# Patient Record
Sex: Female | Born: 1995 | Marital: Single | Smoking: Never smoker
Health system: Southern US, Community
[De-identification: ages and names within clinical notes are randomized; demographics above are authoritative.]

---

## 2013-05-17 ENCOUNTER — Ambulatory Visit (INDEPENDENT_AMBULATORY_CARE_PROVIDER_SITE_OTHER): Payer: Managed Care, Other (non HMO) | Admitting: Sports Medicine

## 2013-05-17 DIAGNOSIS — L750 Bromhidrosis: Secondary | ICD-10-CM | POA: Insufficient documentation

## 2013-05-17 DIAGNOSIS — L708 Other acne: Secondary | ICD-10-CM

## 2013-05-17 DIAGNOSIS — M654 Radial styloid tenosynovitis [de Quervain]: Secondary | ICD-10-CM

## 2013-05-17 DIAGNOSIS — L748 Other eccrine sweat disorders: Secondary | ICD-10-CM

## 2013-05-17 DIAGNOSIS — L700 Acne vulgaris: Secondary | ICD-10-CM | POA: Insufficient documentation

## 2013-05-17 DIAGNOSIS — R6889 Other general symptoms and signs: Secondary | ICD-10-CM

## 2013-05-17 DIAGNOSIS — H6120 Impacted cerumen, unspecified ear: Secondary | ICD-10-CM | POA: Insufficient documentation

## 2013-05-17 DIAGNOSIS — R196 Halitosis: Secondary | ICD-10-CM

## 2013-05-17 DIAGNOSIS — B070 Plantar wart: Secondary | ICD-10-CM

## 2013-05-17 DIAGNOSIS — J309 Allergic rhinitis, unspecified: Secondary | ICD-10-CM

## 2013-05-17 DIAGNOSIS — H6123 Impacted cerumen, bilateral: Secondary | ICD-10-CM

## 2013-05-17 MED ORDER — FLUTICASONE PROPIONATE 50 MCG/ACT NA SUSP: NASAL | Status: DC

## 2013-05-17 MED ORDER — CLINDAMYCIN PHOS-BENZOYL PEROX 1-5 % EX GEL: Freq: Two times a day (BID) | CUTANEOUS | Status: DC

## 2013-05-17 NOTE — Assessment & Plan Note (Signed)
Brush teeth twice a day, mouthwash twice a day.

## 2013-05-17 NOTE — Patient Instructions (Addendum)
Ensure that the Deodorant has Aluminum Zirconium.

## 2013-05-17 NOTE — Assessment & Plan Note (Signed)
Plays guitar. Thumb spica brace. Ibuprofen 800 mg 3 times a day. Home exercises. Return in a few months, if no better consider injection.

## 2013-05-17 NOTE — Assessment & Plan Note (Signed)
Deodorant twice a day, Ensure contains aluminum zirconium tetrachloride.

## 2013-05-17 NOTE — Assessment & Plan Note (Signed)
BenzaClin twice a day, return in 3 months.

## 2013-05-17 NOTE — Assessment & Plan Note (Signed)
Over the counter H1 blockers, Flonase. Return as needed for this.

## 2013-05-17 NOTE — Assessment & Plan Note (Signed)
Removed by physician with curette bilaterally.

## 2013-05-17 NOTE — Progress Notes (Signed)
  Subjective:    CC: Establish care.   HPI:  This pleasant 17 year old female comes in with multiple complaints, I am seeing her father, mother, and sister.  Acne: Present for years, feels that there is a malodorous discharge from the face.  Halitosis: Brushes teeth daily, does not use mouthwash. Has braces.  Abnormal body: uses over-the-counter deodorant, did change a little bit with puberty, does not sweat excessively. Showers daily.  Left wrist pain: In school for music, and plays the guitar, pain is localized over the radial styloid process, radiates into the forearm, did have an injection in the distant past which only provided minimal benefit, has never had bracing or physical therapy. Moderate, persistent.  Runny nose: Typically happens around this season.  Plantar wart: Desires cryotherapy.  Past medical history, Surgical history, Family history not pertinant except as noted below, Social history, Allergies, and medications have been entered into the medical record, reviewed, and no changes needed.   Review of Systems: No headache, visual changes, nausea, vomiting, diarrhea, constipation, dizziness, abdominal pain, skin rash, fevers, chills, night sweats, swollen lymph nodes, weight loss, chest pain, body aches, joint swelling, muscle aches, shortness of breath, mood changes, visual or auditory hallucinations.  Objective:    General: Well Developed, well nourished, and in no acute distress.  Neuro: Alert and oriented x3, extra-ocular muscles intact, sensation grossly intact.  HEENT: Normocephalic, atraumatic, pupils equal round reactive to light, neck supple, no masses, no lymphadenopathy, thyroid nonpalpable. Cerumen impactions present bilaterally.  Skin: Warm and dry, no rashes noted. Plantar wart on the right foot.  Cardiac: Regular rate and rhythm, no murmurs rubs or gallops.  Respiratory: Clear to auscultation bilaterally. Not using accessory muscles, speaking in full  sentences.  Abdominal: Soft, nontender, nondistended, positive bowel sounds, no masses, no organomegaly.  Left Wrist: Inspection normal with no visible erythema or swelling. ROM smooth and normal with good flexion and extension and ulnar/radial deviation that is symmetrical with opposite wrist. Palpation is normal over metacarpals, navicular, lunate, and TFCC; tendons without tenderness/ swelling No snuffbox tenderness. No tenderness over Canal of [place]. Strength 5/5 in all directions without pain. Positive Finkelstein sign, negative Tinel's and Phalen's signs. Negative Watson's test.  Procedure:  Cryodestruction of right foot plantar wart. Consent obtained and verified. Time-out conducted. Noted no overlying erythema, induration, or other signs of local infection. Completed without difficulty using Cryo-Gun. Advised to call if fevers/chills, erythema, induration, drainage, or persistent bleeding.  Indication: Cerumen impaction of both ear(s) Medical necessity statement: On physical examination, cerumen impairs clinically significant portions of the external auditory canal, and tympanic membrane. Noted obstructive, copious cerumen that cannot be removed without magnification and instrumentations requiring physician skills Consent: Discussed benefits and risks of procedure and verbal consent obtained Procedure: Patient was prepped for the procedure. Utilized an otoscope to assess and take note of the ear canal, the tympanic membrane, and the presence, amount, and placement of the cerumen. Gentle water irrigation and soft plastic curette was utilized to remove cerumen.  Post procedure examination: shows cerumen was completely removed. Patient tolerated procedure well. The patient is made aware that they may experience temporary vertigo, temporary hearing loss, and temporary discomfort. If these symptom last for more than 24 hours to call the clinic or proceed to the ED.  Impression and  Recommendations:    The patient was counselled, risk factors were discussed, anticipatory guidance given.

## 2013-05-17 NOTE — Assessment & Plan Note (Signed)
Cryotherapy performed. Repeat as needed.

## 2013-08-13 ENCOUNTER — Ambulatory Visit (INDEPENDENT_AMBULATORY_CARE_PROVIDER_SITE_OTHER): Payer: Managed Care, Other (non HMO) | Admitting: Sports Medicine

## 2013-08-13 ENCOUNTER — Encounter: Payer: Self-pay | Admitting: Sports Medicine

## 2013-08-13 VITALS — BP 124/67 | HR 75 | Wt 135.0 lb

## 2013-08-13 DIAGNOSIS — L708 Other acne: Secondary | ICD-10-CM

## 2013-08-13 DIAGNOSIS — L700 Acne vulgaris: Secondary | ICD-10-CM

## 2013-08-13 DIAGNOSIS — M654 Radial styloid tenosynovitis [de Quervain]: Secondary | ICD-10-CM

## 2013-08-13 MED ORDER — DICLOFENAC SODIUM 1 % TD GEL: 2.0000 g | Freq: Four times a day (QID) | TRANSDERMAL | Status: DC

## 2013-08-13 NOTE — Progress Notes (Signed)
  Subjective:    CC:  Follow up  HPI: Acne: Improve significantly with BenzaClin.  Body odor: Improved significantly with using aluminum-containing deodorants.  De Quervain's tenosynovitis: Persistent though somewhat better with anti-inflammatories at home rehabilitation. She is continuing to have pain and does desire interventional treatment.  Past medical history, Surgical history, Family history not pertinant except as noted below, Social history, Allergies, and medications have been entered into the medical record, reviewed, and no changes needed.   Review of Systems: No fevers, chills, night sweats, weight loss, chest pain, or shortness of breath.   Objective:    General: Well Developed, well nourished, and in no acute distress.  Neuro: Alert and oriented x3, extra-ocular muscles intact, sensation grossly intact.  HEENT: Normocephalic, atraumatic, pupils equal round reactive to light, neck supple, no masses, no lymphadenopathy, thyroid nonpalpable.  Skin: Warm and dry, no rashes. Cardiac: Regular rate and rhythm, no murmurs rubs or gallops, no lower extremity edema.  Respiratory: Clear to auscultation bilaterally. Not using accessory muscles, speaking in full sentences. Left Wrist: Inspection normal with no visible erythema or swelling. ROM smooth and normal with good flexion and extension and ulnar/radial deviation that is symmetrical with opposite wrist. Palpation is normal over metacarpals, navicular, lunate, and TFCC; tendons without tenderness/ swelling No snuffbox tenderness. No tenderness over Canal of [place]. Strength 5/5 in all directions without pain. Positive Idell Pickles site. Negative Watson's test.   Procedure: Real-time Ultrasound Guided Injection of  Left first extensor compartment Device: GE Logiq E  Verbal informed consent obtained.  Time-out conducted.  Noted no overlying erythema, induration, or other signs of local infection.  Skin prepped in a sterile  fashion.  Local anesthesia: Topical Ethyl chloride.  With sterile technique and under real time ultrasound guidance:  25-gauge needle advanced into the first extensor compartment, 1 cc Kenalog 40, 2 cc lidocaine injected easily.  Completed without difficulty  Pain immediately resolved suggesting accurate placement of the medication.  Advised to call if fevers/chills, erythema, induration, drainage, or persistent bleeding.  Images permanently stored and available for review in the ultrasound unit.  Impression: Technically successful ultrasound guided injection.  Impression and Recommendations:

## 2013-08-13 NOTE — Assessment & Plan Note (Signed)
Improved significantly but still with persistent symptoms. Patient does desire interventional treatment. First extensor compartment injection performed today Continue exercises. Voltaren gel topically. Return in one month regarding this.

## 2013-08-13 NOTE — Assessment & Plan Note (Signed)
Excellent response to BenzaClin, no refills needed.

## 2013-12-27 ENCOUNTER — Ambulatory Visit (INDEPENDENT_AMBULATORY_CARE_PROVIDER_SITE_OTHER): Payer: Managed Care, Other (non HMO) | Admitting: Sports Medicine

## 2013-12-27 ENCOUNTER — Encounter: Payer: Self-pay | Admitting: Sports Medicine

## 2013-12-27 VITALS — BP 115/76 | HR 96 | Ht 63.0 in | Wt 131.0 lb

## 2013-12-27 DIAGNOSIS — M654 Radial styloid tenosynovitis [de Quervain]: Secondary | ICD-10-CM

## 2013-12-27 NOTE — Progress Notes (Signed)
  Subjective:    CC: Discoloration of skin  HPI: Approximately 5 months ago this pleasant 18 year old female had her first extensor compartment injection. 2 months later she developed some blisters, and irritation along the course of the first extensor compartment, she did develop some hypopigmentation and is here for me to look at. Is not painful, she does have a little stiffness in her thumb is mild. Symptoms are improving.  Past medical history, Surgical history, Family history not pertinant except as noted below, Social history, Allergies, and medications have been entered into the medical record, reviewed, and no changes needed.   Review of Systems: No fevers, chills, night sweats, weight loss, chest pain, or shortness of breath.   Objective:    General: Well Developed, well nourished, and in no acute distress.  Neuro: Alert and oriented x3, extra-ocular muscles intact, sensation grossly intact.  HEENT: Normocephalic, atraumatic, pupils equal round reactive to light, neck supple, no masses, no lymphadenopathy, thyroid nonpalpable.  Skin: Warm and dry, no rashes. Cardiac: Regular rate and rhythm, no murmurs rubs or gallops, no lower extremity edema.  Respiratory: Clear to auscultation bilaterally. Not using accessory muscles, speaking in full sentences. Left wrist: There is a discrete area of hypopigmentation along the first extensor compartment of the left wrist.  Impression and Recommendations:

## 2013-12-27 NOTE — Assessment & Plan Note (Signed)
There is some post injection hypopigmentation, this is not unexpected, and not pathologic. This will resolve on its own in the next several months. Return as needed.

## 2014-07-23 ENCOUNTER — Ambulatory Visit (INDEPENDENT_AMBULATORY_CARE_PROVIDER_SITE_OTHER): Payer: Managed Care, Other (non HMO)

## 2014-07-23 ENCOUNTER — Ambulatory Visit (INDEPENDENT_AMBULATORY_CARE_PROVIDER_SITE_OTHER): Payer: Managed Care, Other (non HMO) | Admitting: Sports Medicine

## 2014-07-23 ENCOUNTER — Encounter: Payer: Self-pay | Admitting: Sports Medicine

## 2014-07-23 VITALS — BP 105/63 | HR 84 | Ht 63.0 in | Wt 129.0 lb

## 2014-07-23 DIAGNOSIS — J209 Acute bronchitis, unspecified: Secondary | ICD-10-CM

## 2014-07-23 DIAGNOSIS — J029 Acute pharyngitis, unspecified: Secondary | ICD-10-CM

## 2014-07-23 DIAGNOSIS — R05 Cough: Secondary | ICD-10-CM

## 2014-07-23 MED ORDER — HYDROCOD POLST-CHLORPHEN POLST 10-8 MG/5ML PO LQCR: 5.0000 mL | Freq: Two times a day (BID) | ORAL | Status: DC | PRN

## 2014-07-23 MED ORDER — BENZONATATE 200 MG PO CAPS: 200.0000 mg | ORAL_CAPSULE | Freq: Three times a day (TID) | ORAL | Status: DC | PRN

## 2014-07-23 NOTE — Patient Instructions (Signed)
Acute Bronchitis °Bronchitis is inflammation of the airways that extend from the windpipe into the lungs (bronchi). The inflammation often causes mucus to develop. This leads to a cough, which is the most common symptom of bronchitis.  °In acute bronchitis, the condition usually develops suddenly and goes away over time, usually in a couple weeks. Smoking, allergies, and asthma can make bronchitis worse. Repeated episodes of bronchitis may cause further lung problems.  °CAUSES °Acute bronchitis is most often caused by the same virus that causes a cold. The virus can spread from person to person (contagious) through coughing, sneezing, and touching contaminated objects. °SIGNS AND SYMPTOMS  °· Cough.   °· Fever.   °· Coughing up mucus.   °· Body aches.   °· Chest congestion.   °· Chills.   °· Shortness of breath.   °· Sore throat.   °DIAGNOSIS  °Acute bronchitis is usually diagnosed through a physical exam. Your health care provider will also ask you questions about your medical history. Tests, such as chest X-rays, are sometimes done to rule out other conditions.  °TREATMENT  °Acute bronchitis usually goes away in a couple weeks. Oftentimes, no medical treatment is necessary. Medicines are sometimes given for relief of fever or cough. Antibiotic medicines are usually not needed but may be prescribed in certain situations. In some cases, an inhaler may be recommended to help reduce shortness of breath and control the cough. A cool mist vaporizer may also be used to help thin bronchial secretions and make it easier to clear the chest.  °HOME CARE INSTRUCTIONS °· Get plenty of rest.   °· Drink enough fluids to keep your urine clear or pale yellow (unless you have a medical condition that requires fluid restriction). Increasing fluids may help thin your respiratory secretions (sputum) and reduce chest congestion, and it will prevent dehydration.   °· Take medicines only as directed by your health care provider. °· If  you were prescribed an antibiotic medicine, finish it all even if you start to feel better. °· Avoid smoking and secondhand smoke. Exposure to cigarette smoke or irritating chemicals will make bronchitis worse. If you are a smoker, consider using nicotine gum or skin patches to help control withdrawal symptoms. Quitting smoking will help your lungs heal faster.   °· Reduce the chances of another bout of acute bronchitis by washing your hands frequently, avoiding people with cold symptoms, and trying not to touch your hands to your mouth, nose, or eyes.   °· Keep all follow-up visits as directed by your health care provider.   °SEEK MEDICAL CARE IF: °Your symptoms do not improve after 1 week of treatment.  °SEEK IMMEDIATE MEDICAL CARE IF: °· You develop an increased fever or chills.   °· You have chest pain.   °· You have severe shortness of breath. °· You have bloody sputum.   °· You develop dehydration. °· You faint or repeatedly feel like you are going to pass out. °· You develop repeated vomiting. °· You develop a severe headache. °MAKE SURE YOU:  °· Understand these instructions. °· Will watch your condition. °· Will get help right away if you are not doing well or get worse. °Document Released: 09/16/2004 Document Revised: 12/24/2013 Document Reviewed: 01/30/2013 °[employer]® Patient Information ©2015 [employer]. This information is not intended to replace advice given to you by your health care provider. Make sure you discuss any questions you have with your health care provider. ° °

## 2014-07-23 NOTE — Assessment & Plan Note (Signed)
Tessalon Perles and Tussionex. Decreased movement in the right side, chest x-ray. Antibiotics if any evidence of infiltrate.

## 2014-07-23 NOTE — Progress Notes (Signed)
  Subjective:    CC: Coughing  HPI: This is a pleasant 18 year old female, for the past several days she's had an increasing cough that is nonproductive, has not left the country, no fevers, chills, night sweats, sore throat, no runny nose, no headaches, no GI symptoms. No sick contacts.  Past medical history, Surgical history, Family history not pertinant except as noted below, Social history, Allergies, and medications have been entered into the medical record, reviewed, and no changes needed.   Review of Systems: No fevers, chills, night sweats, weight loss, chest pain, or shortness of breath.   Objective:    General: Well Developed, well nourished, and in no acute distress.  Neuro: Alert and oriented x3, extra-ocular muscles intact, sensation grossly intact.  HEENT: Normocephalic, atraumatic, pupils equal round reactive to light, neck supple, no masses, no lymphadenopathy, thyroid nonpalpable. Oropharynx, nasopharynx, external ear canals are unremarkable. Skin: Warm and dry, no rashes. Cardiac: Regular rate and rhythm, no murmurs rubs or gallops, no lower extremity edema.  Respiratory: Clear to auscultation bilaterally. Not using accessory muscles, speaking in full sentences. Slightly decreased sounds on the right side. No rhonchi or rales. No wheezing.  Impression and Recommendations:

## 2014-09-30 ENCOUNTER — Ambulatory Visit (INDEPENDENT_AMBULATORY_CARE_PROVIDER_SITE_OTHER): Payer: Managed Care, Other (non HMO) | Admitting: Sports Medicine

## 2014-09-30 ENCOUNTER — Encounter: Payer: Self-pay | Admitting: Sports Medicine

## 2014-09-30 DIAGNOSIS — L989 Disorder of the skin and subcutaneous tissue, unspecified: Secondary | ICD-10-CM

## 2014-09-30 DIAGNOSIS — L249 Irritant contact dermatitis, unspecified cause: Secondary | ICD-10-CM | POA: Insufficient documentation

## 2014-09-30 MED ORDER — TRIAMCINOLONE ACETONIDE 0.5 % EX CREA: 1.0000 "application " | TOPICAL_CREAM | Freq: Two times a day (BID) | CUTANEOUS | Status: DC

## 2014-09-30 NOTE — Progress Notes (Signed)
  Subjective:    CC:  Rash on legs  HPI:  Danielle BienenstockBrandy returns, she has a hyperpigmented rash on the lateral aspect of her right ankle on the medial aspect of her left ankle. It is occasionally itchy. She denies any trauma, denies any changes in perfumes or lotions, or detergents. Her father is very concerned that she has a parasite growing in her leg and would like me to test for this. She is somewhat nervous about aggressive testing, but agrees. Symptoms are predominantly pruritus.  Past medical history, Surgical history, Family history not pertinant except as noted below, Social history, Allergies, and medications have been entered into the medical record, reviewed, and no changes needed.   Review of Systems: No fevers, chills, night sweats, weight loss, chest pain, or shortness of breath.   Objective:    General: Well Developed, well nourished, and in no acute distress.  Neuro: Alert and oriented x3, extra-ocular muscles intact, sensation grossly intact.  HEENT: Normocephalic, atraumatic, pupils equal round reactive to light, neck supple, no masses, no lymphadenopathy, thyroid nonpalpable.  Skin: Warm and dry,  There is a mild hyperpigmented rash with minimal tenderness and a few nodules  Over her right lateral ankle and her left medial ankle. No signs of bacterial superinfection, no erythema, induration, no warmth. There is tenderness over the 2 hyperpigmented nodules. Cardiac: Regular rate and rhythm, no murmurs rubs or gallops, no lower extremity edema.  Respiratory: Clear to auscultation bilaterally. Not using accessory muscles, speaking in full sentences.  Procedure:  Excision of 1 cm right anterior ankle subcutaneous nodule Risks, benefits, and alternatives explained and consent obtained. Time out conducted. Surface prepped with alcohol. 1cc lidocaine with epinephine infiltrated in a field block. Adequate anesthesia ensured. Area prepped and draped in a sterile fashion. Excision  performed with:a 6 mm punch biopsy was used to take a sample of the entire nodule and some of the rash, I then placed a single 4-0 Prolene horizontal mattress suture to close the wound. Hemostasis achieved. Pt stable.  Impression and Recommendations:

## 2014-09-30 NOTE — Assessment & Plan Note (Signed)
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 No other identifiable cause, patient and father are from another country and very concerned about a parasite, so biopsy was performed today per their request. I'm going to add topical triamcinolone, and we will continue to follow the results of the biopsy.

## 2014-10-08 ENCOUNTER — Encounter: Payer: Self-pay | Admitting: Sports Medicine

## 2014-10-08 ENCOUNTER — Ambulatory Visit (INDEPENDENT_AMBULATORY_CARE_PROVIDER_SITE_OTHER): Payer: Managed Care, Other (non HMO) | Admitting: Sports Medicine

## 2014-10-08 VITALS — BP 122/74 | HR 87 | Wt 121.0 lb

## 2014-10-08 DIAGNOSIS — L249 Irritant contact dermatitis, unspecified cause: Secondary | ICD-10-CM

## 2014-10-08 NOTE — Progress Notes (Signed)
  Subjective:  Returns 1 week after skin biopsy for cutaneous process, symptoms have improved significantly with topical cream. Objective: General: Well-developed, well-nourished, and in no acute distress. Wound: Clean, dry, intact, the single horizontal mattress Prolene suture was removed today.  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 Her father was concerned there was a parasitic process, there was no evidence of eosinophilia or parasitic structures in the biopsy specimen, specifically commented on by the pathologist.  Assessment/plan:

## 2014-10-08 NOTE — Assessment & Plan Note (Addendum)
Continue cream. Sutures removed today. Return as needed. Advised to maintain a neutral position of her legs.

## 2015-04-01 ENCOUNTER — Ambulatory Visit: Payer: Managed Care, Other (non HMO) | Admitting: Family Medicine

## 2015-04-01 ENCOUNTER — Ambulatory Visit: Payer: Managed Care, Other (non HMO) | Admitting: Sports Medicine

## 2015-04-03 ENCOUNTER — Ambulatory Visit: Payer: Managed Care, Other (non HMO) | Admitting: Sports Medicine

## 2015-04-07 ENCOUNTER — Ambulatory Visit (INDEPENDENT_AMBULATORY_CARE_PROVIDER_SITE_OTHER): Payer: Managed Care, Other (non HMO)

## 2015-04-07 ENCOUNTER — Encounter: Payer: Self-pay | Admitting: Sports Medicine

## 2015-04-07 ENCOUNTER — Ambulatory Visit (INDEPENDENT_AMBULATORY_CARE_PROVIDER_SITE_OTHER): Payer: Managed Care, Other (non HMO) | Admitting: Sports Medicine

## 2015-04-07 VITALS — BP 130/72 | HR 90 | Ht 63.0 in | Wt 124.0 lb

## 2015-04-07 DIAGNOSIS — F329 Major depressive disorder, single episode, unspecified: Secondary | ICD-10-CM

## 2015-04-07 DIAGNOSIS — F32A Depression, unspecified: Secondary | ICD-10-CM | POA: Insufficient documentation

## 2015-04-07 DIAGNOSIS — R519 Headache, unspecified: Secondary | ICD-10-CM | POA: Insufficient documentation

## 2015-04-07 DIAGNOSIS — R51 Headache: Secondary | ICD-10-CM

## 2015-04-07 DIAGNOSIS — R202 Paresthesia of skin: Secondary | ICD-10-CM

## 2015-04-07 DIAGNOSIS — F320 Major depressive disorder, single episode, mild: Secondary | ICD-10-CM | POA: Insufficient documentation

## 2015-04-07 DIAGNOSIS — R200 Anesthesia of skin: Secondary | ICD-10-CM | POA: Insufficient documentation

## 2015-04-07 NOTE — Progress Notes (Signed)
  Subjective:    CC: several complaints  HPI: Right hand numbness: Plays a guitar, hands are kept in a position of deep flexion for considerable periods of time,now has numbness and tingling into the first, second, third fingers. Moderate, persistent with minimal radiation of pain into the volar forearm.  Headaches: Localizes in the posterior aspect of the head with some numbness and tingling sensations in the back of the head itself, no visual changes, nausea, her only focal neurologic deficit his right hand numbness. No trauma, no fevers or chills. Headaches are minimal, nothing is throbbing. No morning nausea  Past medical history, Surgical history, Family history not pertinant except as noted below, Social history, Allergies, and medications have been entered into the medical record, reviewed, and no changes needed.   Review of Systems: No fevers, chills, night sweats, weight loss, chest pain, or shortness of breath.   Objective:    General: Well Developed, well nourished, and in no acute distress.  Neuro: Alert and oriented x3, extra-ocular muscles intact, sensation grossly intact.  Cranial nerves II through XII are intact, motor, sensory and coordinative functions are all intact.  HEENT: Normocephalic, atraumatic, pupils equal round reactive to light, neck supple, no masses, no lymphadenopathy, thyroid nonpalpable.  Skin: Warm and dry, no rashes. Cardiac: Regular rate and rhythm, no murmurs rubs or gallops, no lower extremity edema.  Respiratory: Clear to auscultation bilaterally. Not using accessory muscles, speaking in full sentences. Right Wrist: Inspection normal with no visible erythema or swelling. ROM smooth and normal with good flexion and extension and ulnar/radial deviation that is symmetrical with opposite wrist. Palpation is normal over metacarpals, navicular, lunate, and TFCC; tendons without tenderness/ swelling No snuffbox tenderness. No tenderness over Canal of  [place]. Strength 5/5 in all directions without pain. Strongly positive Tinel's and Phalen's sign on the right side, negative Finkelstein sign. Negative Watson's test.  Brain MRI obtained and is negative  Impression and Recommendations:

## 2015-04-07 NOTE — Assessment & Plan Note (Signed)
Most likely carpal tunnel syndrome from playing the guitar, we will start with nighttime splinting, return in one month.

## 2015-04-07 NOTE — Assessment & Plan Note (Signed)
With right arm paresthesias. Brain MRI without contrast stat.

## 2015-04-14 ENCOUNTER — Encounter: Payer: Self-pay | Admitting: Sports Medicine

## 2015-04-14 ENCOUNTER — Ambulatory Visit (INDEPENDENT_AMBULATORY_CARE_PROVIDER_SITE_OTHER): Payer: Managed Care, Other (non HMO) | Admitting: Sports Medicine

## 2015-04-14 VITALS — BP 119/64 | HR 93 | Ht 63.0 in | Wt 122.0 lb

## 2015-04-14 DIAGNOSIS — R519 Headache, unspecified: Secondary | ICD-10-CM

## 2015-04-14 DIAGNOSIS — Z789 Other specified health status: Secondary | ICD-10-CM

## 2015-04-14 DIAGNOSIS — R200 Anesthesia of skin: Secondary | ICD-10-CM

## 2015-04-14 DIAGNOSIS — R51 Headache: Secondary | ICD-10-CM | POA: Diagnosis not present

## 2015-04-14 DIAGNOSIS — R202 Paresthesia of skin: Secondary | ICD-10-CM

## 2015-04-14 NOTE — Assessment & Plan Note (Signed)
Checking MMR titers, varicella-zoster titers, as well as hepatitis B surface antibody

## 2015-04-14 NOTE — Assessment & Plan Note (Signed)
Continue nighttimes blunting, symptoms likely represent carpal tunnel syndrome.

## 2015-04-14 NOTE — Assessment & Plan Note (Signed)
Brain MRI is negative, headaches are likely migraines. May use over-the-counter analgesia.

## 2015-04-14 NOTE — Progress Notes (Signed)
  Subjective:    CC: MRI results  HPI: Frequent headaches: This pleasant 19 year old phenol returns, she did have frequent headaches as well as right hand numbness and tingling that was clinically suspected to be carpal tunnel syndrome, however combined with morning headaches we opted to obtain an MRI of the brain for further evaluation of intracranial pathology. Brain MRI results will be dictated below, her numbness and tingling has been improving, she still has a bit of burning sensation with nighttime splinting.  Proof of immunity: Starting college in [place], needs proof of measles, mumps, rubella immunity.  Past medical history, Surgical history, Family history not pertinant except as noted below, Social history, Allergies, and medications have been entered into the medical record, reviewed, and no changes needed.   Review of Systems: No fevers, chills, night sweats, weight loss, chest pain, or shortness of breath.   Objective:    General: Well Developed, well nourished, and in no acute distress.  Neuro: Alert and oriented x3, extra-ocular muscles intact, sensation grossly intact.  HEENT: Normocephalic, atraumatic, pupils equal round reactive to light, neck supple, no masses, no lymphadenopathy, thyroid nonpalpable.  Skin: Warm and dry, no rashes. Cardiac: Regular rate and rhythm, no murmurs rubs or gallops, no lower extremity edema.  Respiratory: Clear to auscultation bilaterally. Not using accessory muscles, speaking in full sentences.  Brain MRI was reviewed with the patient,Completely negative with the exception of some metal artifact from her braces  Impression and Recommendations:    I spent 25 minutes with this patient, greater than 50% was face-to-face time counseling regarding the above diagnoses

## 2015-04-19 LAB — HEPATITIS B SURFACE ANTIBODY, QUANTITATIVE: Hep B S AB Quant (Post): 6.6 m[IU]/mL

## 2015-04-21 LAB — QUANTIFERON TB GOLD ASSAY (BLOOD)
Interferon Gamma Release Assay: NEGATIVE
Mitogen value: 8.63 IU/mL
Quantiferon Nil Value: 0.04 IU/mL
Quantiferon Tb Ag Minus Nil Value: -0.01 IU/mL
TB Ag value: 0.03 [IU]/mL

## 2015-04-21 LAB — MEASLES/MUMPS/RUBELLA IMMUNITY
Mumps IgG: 297 AU/mL — ABNORMAL HIGH (ref <9.00)
Rubella: 13.4 Index — ABNORMAL HIGH (ref <0.90)
Rubeola IgG: >300 [AU]/ml — ABNORMAL HIGH (ref <25.00)

## 2015-04-21 LAB — VARICELLA ZOSTER ANTIBODY, IGG: Varicella IgG: 2843 {index} — ABNORMAL HIGH (ref <135.00)

## 2015-05-02 ENCOUNTER — Encounter: Payer: Self-pay | Admitting: Sports Medicine

## 2015-05-02 ENCOUNTER — Ambulatory Visit (INDEPENDENT_AMBULATORY_CARE_PROVIDER_SITE_OTHER): Payer: Managed Care, Other (non HMO) | Admitting: Sports Medicine

## 2015-05-02 VITALS — BP 117/80 | HR 104 | Ht 63.0 in | Wt 122.0 lb

## 2015-05-02 DIAGNOSIS — M654 Radial styloid tenosynovitis [de Quervain]: Secondary | ICD-10-CM | POA: Diagnosis not present

## 2015-05-02 DIAGNOSIS — G5621 Lesion of ulnar nerve, right upper limb: Secondary | ICD-10-CM

## 2015-05-02 DIAGNOSIS — G5601 Carpal tunnel syndrome, right upper limb: Secondary | ICD-10-CM | POA: Insufficient documentation

## 2015-05-02 MED ORDER — DICLOFENAC SODIUM 2 % TD SOLN: 2.0000 | Freq: Two times a day (BID) | TRANSDERMAL | Status: DC

## 2015-05-02 NOTE — Assessment & Plan Note (Signed)
Nighttime elbow sleeve. Return in one.

## 2015-05-02 NOTE — Assessment & Plan Note (Addendum)
Previously on the left that responded to injection, now on the right. Topical NSAID, rehabilitation exercises.  Return if no better in one month.

## 2015-05-02 NOTE — Progress Notes (Signed)
  Subjective:    CC: Follow-up  HPI: Right hand pain: Danielle Porter has 2 types of pain, she does endorse a burning type sensation over the first extensor compartment, particularly while strumming the guitar, she also has anesthesias into the fourth and fifth fingers particularly at night. Symptoms are moderate, persistent. Left sided carpal tunnel symptoms have completely resolved with nighttime splinting.  Preventative measures: Immune to measles, mumps, rubella, varicella zoster, not immune to hep B. Quantiferon Gold was negative.  Past medical history, Surgical history, Family history not pertinant except as noted below, Social history, Allergies, and medications have been entered into the medical record, reviewed, and no changes needed.   Review of Systems: No fevers, chills, night sweats, weight loss, chest pain, or shortness of breath.   Objective:    General: Well Developed, well nourished, and in no acute distress.  Neuro: Alert and oriented x3, extra-ocular muscles intact, sensation grossly intact.  HEENT: Normocephalic, atraumatic, pupils equal round reactive to light, neck supple, no masses, no lymphadenopathy, thyroid nonpalpable.  Skin: Warm and dry, no rashes. Cardiac: Regular rate and rhythm, no murmurs rubs or gallops, no lower extremity edema.  Respiratory: Clear to auscultation bilaterally. Not using accessory muscles, speaking in full sentences. Right Wrist: Inspection normal with no visible erythema or swelling. ROM smooth and normal with good flexion and extension and ulnar/radial deviation that is symmetrical with opposite wrist. Palpation is normal over metacarpals, navicular, lunate, and TFCC; tendons without tenderness/ swelling No snuffbox tenderness. No tenderness over Canal of Guyon. Strength 5/5 in all directions without pain. Negative Tinel's and Phalen sign, positive Finkelstein sign. Negative Watson's test. Positive cubital tunnel Tinel's sign.  Impression  and Recommendations:   I spent 25 minutes with this patient, greater than 50% was face-to-face time counseling regarding the above diagnoses

## 2016-10-28 IMAGING — MR MR HEAD W/O CM
10 series · 48 of 48 positions shown · non-contrast
Comparison: None.

CLINICAL DATA: Persistent and frequent headache. RIGHT arm
paresthesias. No reported focal neurologic deficit.

EXAM:
MRI HEAD WITHOUT CONTRAST
TECHNIQUE: Multiplanar, multiecho pulse sequences of the brain and surrounding
structures were obtained without intravenous contrast.

[Series 2: T1 · sagittal · 5.0mm · 0.45mm/px · 2 of 23 slices shown (1 of 2)]
[im 1/23]
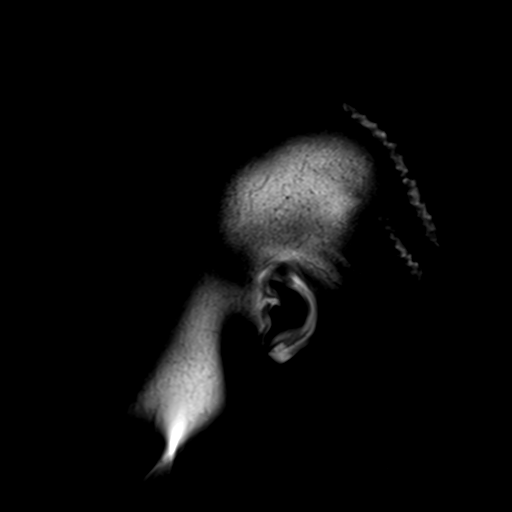
[im 23/23]
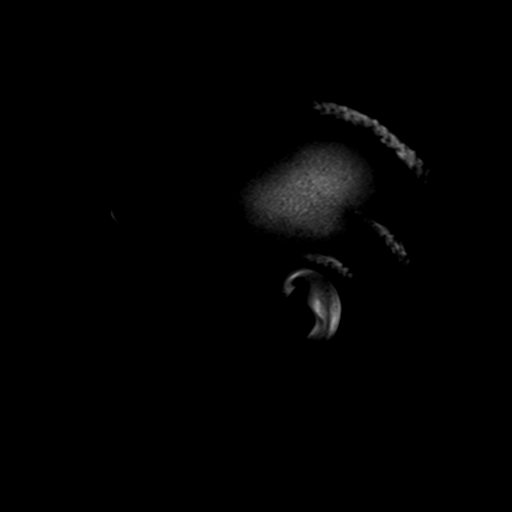

[Series 4: DWI · axial · 3.0mm · 1.20mm/px · z∈[-66,+108]mm · 6 of 59 slices shown (1 of 4)]
[im 1/59]
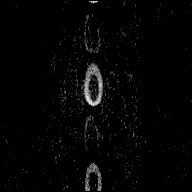
[im 12/59]
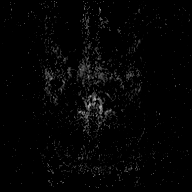
[im 24/59]
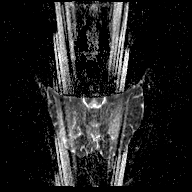
[im 35/59]
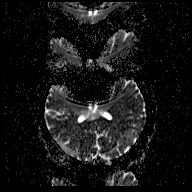
[im 47/59]
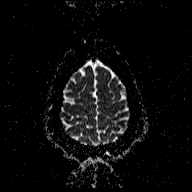
[im 59/59]
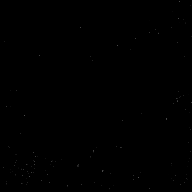

[Series 6: DWI · coronal · 3.0mm · 1.20mm/px · 5 of 50 slices shown (2 of 4)]
[im 1/50]
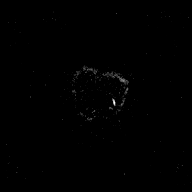
[im 13/50]
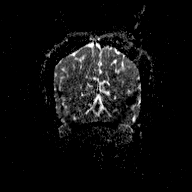
[im 25/50]
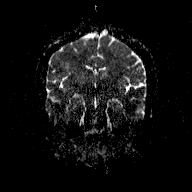
[im 37/50]
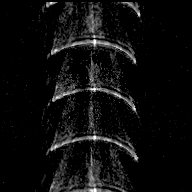
[im 50/50]
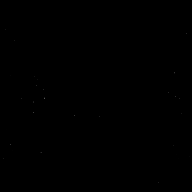

[Series 7: T2 · axial · 5.0mm · 0.72mm/px · z∈[-64,+105]mm · 3 of 27 slices shown (1 of 3)]
[im 1/27]
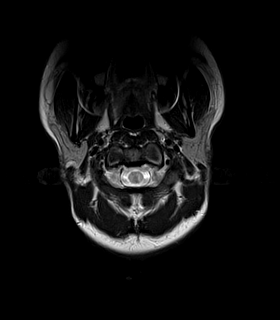
[im 14/27]
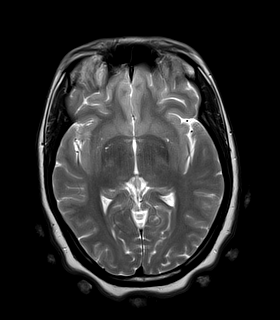
[im 27/27]
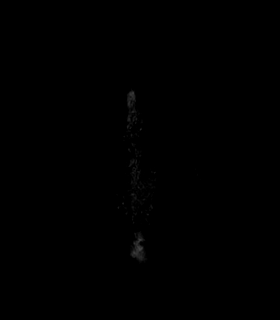

[Series 8: FLAIR · axial · 5.0mm · 0.45mm/px · z∈[-64,+105]mm · 3 of 27 slices shown]
[im 1/27]
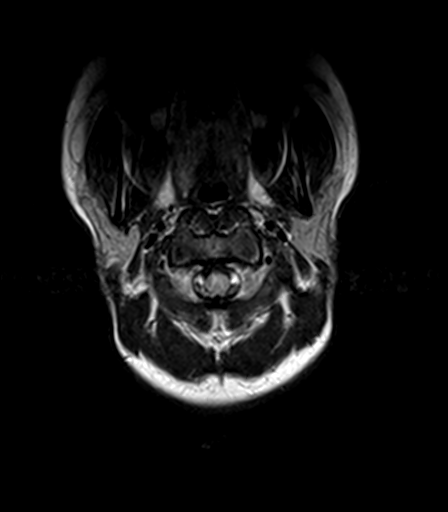
[im 14/27]
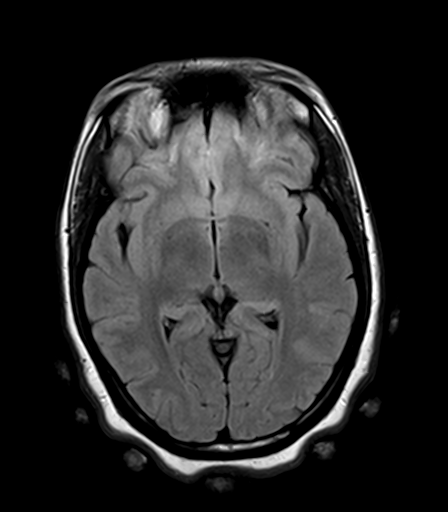
[im 27/27]
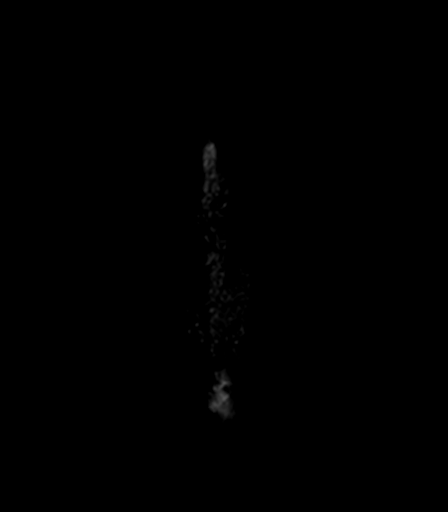

[Series 9: T2 · axial · 5.0mm · 0.72mm/px · z∈[-64,+105]mm · 3 of 27 slices shown (2 of 3)]
[im 1/27]
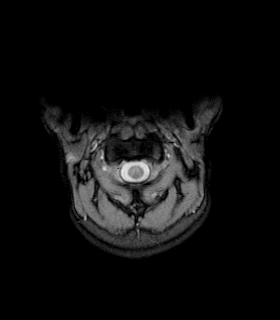
[im 14/27]
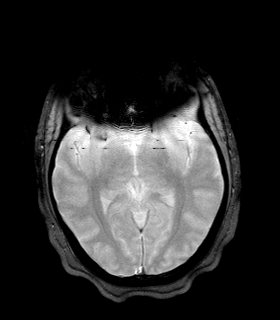
[im 27/27]
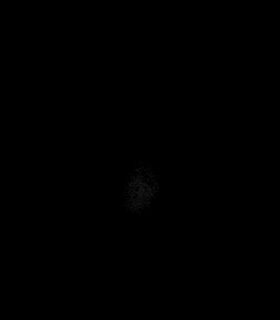

[Series 10: T1 · axial · 3.0mm · 1.00mm/px · z∈[-68,+109]mm · 6 of 60 slices shown (2 of 2)]
[im 1/60]
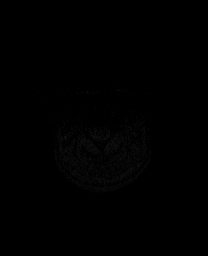
[im 12/60]
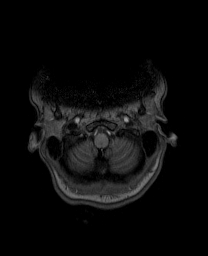
[im 24/60]
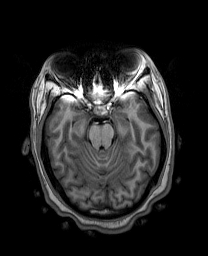
[im 36/60]
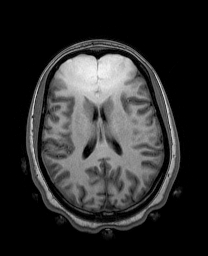
[im 48/60]
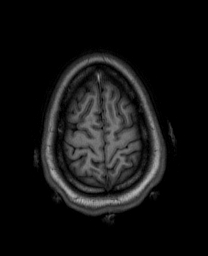
[im 60/60]
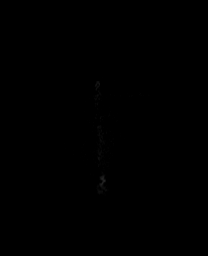

[Series 11: T2 · coronal · 5.0mm · 0.45mm/px · 3 of 31 slices shown (3 of 3)]
[im 1/31]
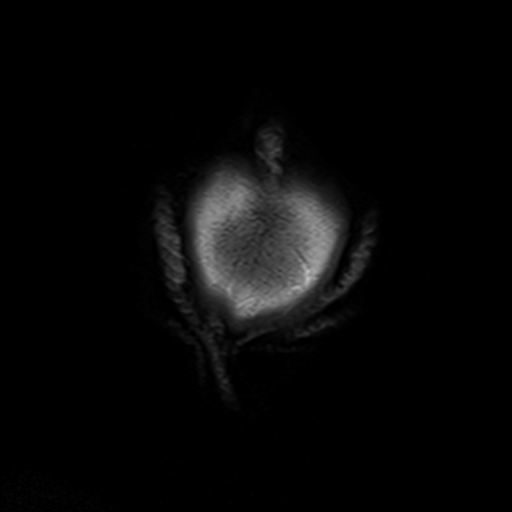
[im 16/31]
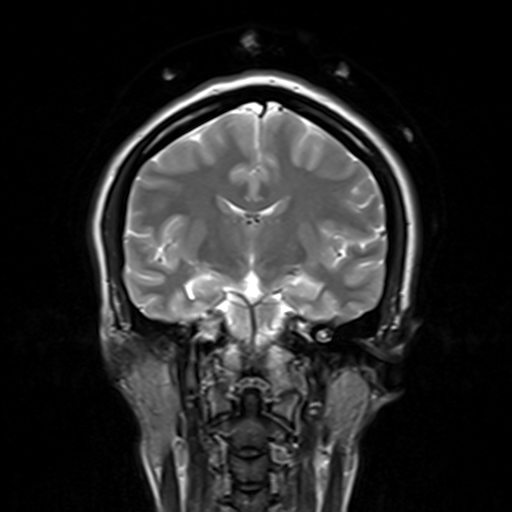
[im 31/31]
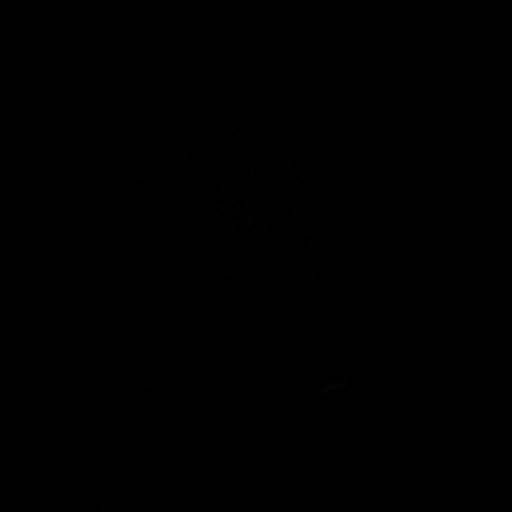

[Series 100: DWI · axial · 3.0mm · 1.20mm/px · z∈[-66,+108]mm · 12 of 113 slices shown (3 of 4)]
[im 1/113]
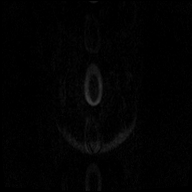
[im 11/113]
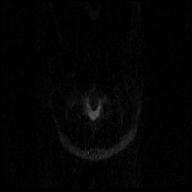
[im 21/113]
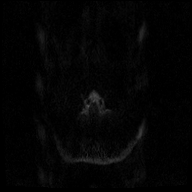
[im 31/113]
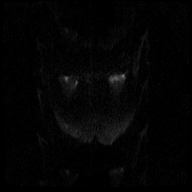
[im 41/113]
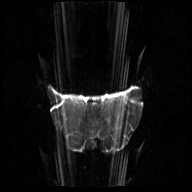
[im 51/113]
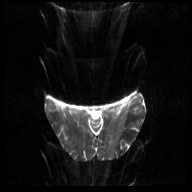
[im 62/113]
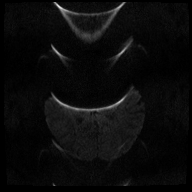
[im 72/113]
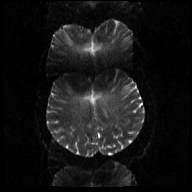
[im 82/113]
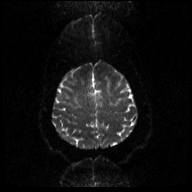
[im 92/113]
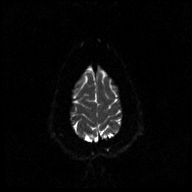
[im 102/113]
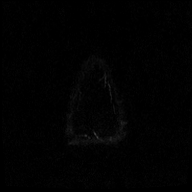
[im 113/113]
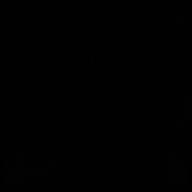

[Series 101: DWI · coronal · 3.0mm · 1.20mm/px · 5 of 47 slices shown (4 of 4)]
[im 1/47]
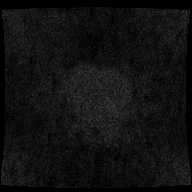
[im 12/47]
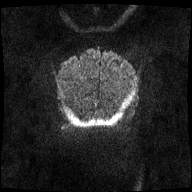
[im 24/47]
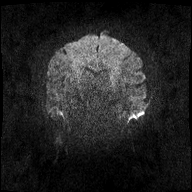
[im 35/47]
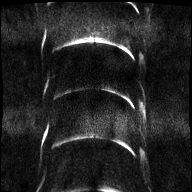
[im 47/47]
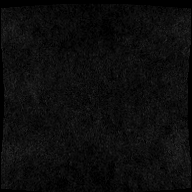

[48 of 48 positions shown; findings below may reference images not displayed]

FINDINGS: There is moderate artifact from patient's braces. Overall the study
is felt to be diagnostic although there are portions of the anterior
inferior frontal lobes, and anterior temporal lobes, which are
incompletely evaluated due to signal dropout.

No evidence for acute infarction, hemorrhage, mass lesion,
hydrocephalus, or extra-axial fluid. Normal cerebral volume. No
white matter disease. Pituitary, pineal, and cerebellar tonsils
unremarkable. No upper cervical lesions. Flow voids are maintained
throughout the carotid, basilar, and vertebral arteries. There are
no areas of chronic hemorrhage.

Visualized calvarium, skull base, and upper cervical osseous
structures unremarkable. Scalp and extracranial soft tissues,
orbits, sinuses, and mastoids show no acute process.
IMPRESSION: Negative exam.

## 2017-03-08 ENCOUNTER — Ambulatory Visit (INDEPENDENT_AMBULATORY_CARE_PROVIDER_SITE_OTHER): Payer: BLUE CROSS/BLUE SHIELD

## 2017-03-08 ENCOUNTER — Encounter: Payer: Self-pay | Admitting: Sports Medicine

## 2017-03-08 ENCOUNTER — Ambulatory Visit (INDEPENDENT_AMBULATORY_CARE_PROVIDER_SITE_OTHER): Payer: BLUE CROSS/BLUE SHIELD | Admitting: Sports Medicine

## 2017-03-08 DIAGNOSIS — M25511 Pain in right shoulder: Secondary | ICD-10-CM | POA: Diagnosis not present

## 2017-03-08 DIAGNOSIS — G8929 Other chronic pain: Secondary | ICD-10-CM | POA: Diagnosis not present

## 2017-03-08 DIAGNOSIS — G5601 Carpal tunnel syndrome, right upper limb: Secondary | ICD-10-CM | POA: Diagnosis not present

## 2017-03-08 DIAGNOSIS — S46011S Strain of muscle(s) and tendon(s) of the rotator cuff of right shoulder, sequela: Secondary | ICD-10-CM | POA: Insufficient documentation

## 2017-03-08 MED ORDER — MELOXICAM 15 MG PO TABS: ORAL_TABLET | ORAL | 3 refills | Status: DC

## 2017-03-08 NOTE — Assessment & Plan Note (Signed)
Nighttime splinting, patient has a wrist brace at home.

## 2017-03-08 NOTE — Progress Notes (Signed)
  Subjective:    CC: Multiple issues  HPI: Right hand numbness and tingling: Occurs typically at night in fingers 2 through 4. Moderate, persistent. Right-sided only.  Right shoulder pain: Took a hard tackle almost 1 year ago playing rugby. Now has pain over the deltoid, worse with overhead activities, no constitutional symptoms. No mechanical symptoms.  Past medical history:  Negative.  See flowsheet/record as well for more information.  Surgical history: Negative.  See flowsheet/record as well for more information.  Family history: Negative.  See flowsheet/record as well for more information.  Social history: Negative.  See flowsheet/record as well for more information.  Allergies, and medications have been entered into the medical record, reviewed, and no changes needed.   Review of Systems: No fevers, chills, night sweats, weight loss, chest pain, or shortness of breath.   Objective:    General: Well Developed, well nourished, and in no acute distress.  Neuro: Alert and oriented x3, extra-ocular muscles intact, sensation grossly intact.  HEENT: Normocephalic, atraumatic, pupils equal round reactive to light, neck supple, no masses, no lymphadenopathy, thyroid nonpalpable.  Skin: Warm and dry, no rashes. Cardiac: Regular rate and rhythm, no murmurs rubs or gallops, no lower extremity edema.  Respiratory: Clear to auscultation bilaterally. Not using accessory muscles, speaking in full sentences. Right Shoulder: Inspection reveals no abnormalities, atrophy or asymmetry. Palpation is normal with no tenderness over AC joint or bicipital groove. ROM is full in all planes. Rotator cuff strength normal throughout. Positive Neer and Hawkin's tests, empty can. Speeds and Yergason's tests normal. No labral pathology noted with negative Obrien's, negative crank, negative clunk, and good stability. Normal scapular function observed. No painful arc and no drop arm sign. No apprehension  sign  Impression and Recommendations:    Carpal tunnel syndrome on right Nighttime splinting, patient has a wrist brace at home.  Rotator cuff strain, right, sequela Occurred almost 1 year ago, does have impingement and supraspinatus symptoms. X-rays, meloxicam, aggressive physical therapy.  Return in 6 weeks, MRI versus injection if no better.  I spent 25 minutes with this patient, greater than 50% was face-to-face time counseling regarding the above diagnoses

## 2017-03-08 NOTE — Assessment & Plan Note (Signed)
Occurred almost 1 year ago, does have impingement and supraspinatus symptoms. X-rays, meloxicam, aggressive physical therapy.  Return in 6 weeks, MRI versus injection if no better.

## 2017-03-30 ENCOUNTER — Ambulatory Visit (INDEPENDENT_AMBULATORY_CARE_PROVIDER_SITE_OTHER): Payer: BLUE CROSS/BLUE SHIELD | Admitting: Rehabilitative and Restorative Service Providers"

## 2017-03-30 ENCOUNTER — Encounter: Payer: Self-pay | Admitting: Rehabilitative and Restorative Service Providers"

## 2017-03-30 DIAGNOSIS — M25511 Pain in right shoulder: Secondary | ICD-10-CM | POA: Diagnosis not present

## 2017-03-30 DIAGNOSIS — G8929 Other chronic pain: Secondary | ICD-10-CM

## 2017-03-30 DIAGNOSIS — R29898 Other symptoms and signs involving the musculoskeletal system: Secondary | ICD-10-CM

## 2017-03-30 DIAGNOSIS — R293 Abnormal posture: Secondary | ICD-10-CM

## 2017-03-30 NOTE — Patient Instructions (Signed)
Axial Extension (Chin Tuck)    Pull chin in and lengthen back of neck. Hold _10___ seconds while counting out loud. Repeat _5-10___ times. Do _several __ sessions per day. Lying down; sitting; standing   Shoulder Blade Squeeze    Rotate shoulders back, then squeeze shoulder blades down and back. Hold 10 sec Repeat __10__ times. Do _several___ sessions per day. Can use swim noodle for tactile cue  Scapula Adduction With Pectoralis Stretch: Low - Standing   Shoulders at 45 hands even with shoulders, keeping weight through legs, shift weight forward until you feel pull or stretch through the front of your chest. Hold _30__ seconds. Do _3__ times, _2-4__ times per day.   Scapula Adduction With Pectoralis Stretch: Mid-Range - Standing   Shoulders at 90 elbows even with shoulders, keeping weight through legs, shift weight forward until you feel pull or strength through the front of your chest. Hold __30_ seconds. Do _3__ times, __2-4_ times per day.   Scapula Adduction With Pectoralis Stretch: High - Standing   Shoulders at 120 hands up high on the doorway, keeping weight on feet, shift weight forward until you feel pull or stretch through the front of your chest. Hold _30__ seconds. Do _3__ times, _2-3__ times per day.    TENS UNIT: This is helpful for muscle pain and spasm.   Search and Purchase a TENS 7000 2nd edition at www.tenspros.com. It should be less than $30.     TENS unit instructions: Do not shower or bathe with the unit on Turn the unit off before removing electrodes or batteries If the electrodes lose stickiness add a drop of water to the electrodes after they are disconnected from the unit and place on plastic sheet. If you continued to have difficulty, call the TENS unit company to purchase more electrodes. Do not apply lotion on the skin area prior to use. Make sure the skin is clean and dry as this will help prolong the life of the electrodes. After  use, always check skin for unusual red areas, rash or other skin difficulties. If there are any skin problems, does not apply electrodes to the same area. Never remove the electrodes from the unit by pulling the wires. Do not use the TENS unit or electrodes other than as directed. Do not change electrode placement without consultating your therapist or physician. Keep 2 fingers with between each electrode.

## 2017-03-30 NOTE — Therapy (Signed)
[facility] [place], [place], [address] Phone: [phone]   Fax:  [phone]  Physical Therapy Evaluation  Patient Details  Name: Danielle Porter MRN: [id number] Date of Birth: [birth year] Referring Provider: Dr Dianah Field   Encounter Date: 03/30/2017      PT End of Session - 03/30/17 1126    Visit Number 1   Number of Visits 12   Date for PT Re-Evaluation 05/11/17   PT Start Time 1021   PT Stop Time 1128   PT Time Calculation (min) 67 min   Activity Tolerance Patient tolerated treatment well      History reviewed. No pertinent past medical history.  History reviewed. No pertinent surgical history.  There were no vitals filed for this visit.       Subjective Assessment - 03/30/17 1022    Subjective Nicolemarie reports that she had an injury to Rt shoulder ~ 1 year ago while playing rugby whe she landed on Rt shoulder. She has numbness in her arm for a couple of minutes and then had trouble lifting arm and doing things with Rt UE. She has had continued limitations in ROM and Rt UE use. sometimes will notice similar symptoms in the Lt shoulder.    Pertinent History Lt wrist tendinotis; carpel tunnel Rt    How long can you sit comfortably? 1 hour    How long can you stand comfortably? if holding something in arm 45 min    How long can you walk comfortably? if holding something in arm 45 min    Diagnostic tests xrays (-)    Patient Stated Goals get rid of shoulder symptoms    Currently in Pain? Yes   Pain Score 3    Pain Location Shoulder   Pain Orientation Right   Pain Descriptors / Indicators Tightness;Pressure   Pain Type Chronic pain   Pain Onset More than a month ago   Pain Frequency Intermittent   Aggravating Factors  sleeping on either side; lifting overhead and heavy items    Pain Relieving Factors waiting             [facility] PT Assessment - 03/30/17 0001      Assessment   Medical  Diagnosis Rt rotator cuff strain; shoulder dysfunction    Referring Provider Dr Dianah Field    Onset Date/Surgical Date 05/23/16   Hand Dominance Right   Next MD Visit 8/18   Prior Therapy none      Precautions   Precautions None     Balance Screen   Has the patient fallen in the past 6 months No   Has the patient had a decrease in activity level because of a fear of falling?  No   Is the patient reluctant to leave their home because of a fear of falling?  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; video games; computer; volunteers at help desk 6 hr/wk      Observation/Other Assessments   Focus on Therapeutic Outcomes (FOTO)  43% limitation      Sensation   Additional Comments numbness and tingling Rt distal arm above elbow band around the entire arm      Posture/Postural Control   Posture Comments head forward; shoudlers rounded and elevated; head of the humerus anterior in orientation; increased thoracic kyphosis; scapulae abducted and  rotated along the thoracic wall      AROM   Right Shoulder Extension 60 Degrees   Right Shoulder Flexion 143 Degrees   Right Shoulder ABduction 134 Degrees   Right Shoulder Internal Rotation 23 Degrees   Right Shoulder External Rotation 27 Degrees   Left Shoulder Extension 53 Degrees   Left Shoulder Flexion 150 Degrees   Left Shoulder ABduction 143 Degrees   Left Shoulder Internal Rotation 35 Degrees   Left Shoulder External Rotation 100 Degrees   Cervical Flexion 69   Cervical Extension 50   Cervical - Right Side Bend 42   Cervical - Left Side Bend 47   Cervical - Right Rotation 72   Cervical - Left Rotation 68     Strength   Overall Strength Comments 5/5 bilat UE's except middle and lower traps 4+/5 with discomfort      Palpation   Spinal mobility hypomobile upper thoracic and cervical spine with CPA and  lateral mobs discomfort C 3/4/5    Palpation comment significant tightness pecs; upper trap; leveator; ant/lat/post cervucal musculature; pecs; biceps      Special Tests    Special Tests --  (+) neural tension test bilat UE's             Objective measurements completed on examination: See above findings.          [facility] PT Treatment/Exercise - 03/30/17 0001      Neuro Re-ed    Neuro Re-ed Details  working on posture and alignment      Shoulder Exercises: Standing   Other Standing Exercises scap squeeze w/ noodle 10 sec x 10      Shoulder Exercises: Stretch   Other Shoulder Stretches 3 way doorway stretch 30 sec x 3    Other Shoulder Stretches neural mobilization to pt tolerance 20-30 sec x 2 reps supine      Moist Heat Therapy   Number Minutes Moist Heat 20 Minutes   Moist Heat Location Cervical;Shoulder     Electrical Stimulation   Electrical Stimulation Location bilat cervical/shoulders   Electrical Stimulation Action IFC   Electrical Stimulation Parameters to tolerance    Electrical Stimulation Goals Pain;Tone     Manual Therapy   Manual therapy comments pt supine    Joint Mobilization CPA mobs upper thoracic and cervical spine    Soft tissue mobilization deep tissue work ant/lat/posterior cervical; upper trap; pecs   Myofascial Release anterior chest; bilat shds      Neck Exercises: Stretches   Other Neck Stretches axial extension 10 sec  supine x 5; standing x 5                PT Education - 03/30/17 1112    Education provided Yes   Education Details HEP TENS    Person(s) Educated Patient   Methods Explanation;Demonstration;Tactile cues;Verbal cues;Handout   Comprehension Verbalized understanding;Returned demonstration;Verbal cues required;Tactile cues required             PT Long Term Goals - 03/30/17 1135      PT LONG TERM GOAL #1   Title Improve posture and alignment with patient to demonstrate upright posture with engaged  posterior shoudler girdle musculature 05/11/17   Time 6   Period Weeks   Status New     PT LONG TERM GOAL #2   Title Decrease radicular symptoms and pain by 50-75% bilateral UE's 05/11/17   Time 6   Period Weeks   Status New  PT LONG TERM GOAL #3   Title Increase AROM Rt shoulder to >/= AROM Lt shoulder 05/11/17   Time 6   Period Weeks   Status New     PT LONG TERM GOAL #4   Title Independent in HEP 05/11/17   Time 6   Period Weeks   Status New     PT LONG TERM GOAL #5   Title Improve FOTO to </= 25% limitation 05/11/17   Time 6   Period Weeks   Status New                Plan - 03/30/17 1126    Clinical Impression Statement Janace Hoard presents with one year history of Rt shoudler and UE symptoms following injury to Rt shoulder when she made a tackle in rugby. She experienced numbness and sensation of "dead arm" at the time of injury. Janace Hoard has had persistent intermittent symtpoms in Rt shoudler and UE  with less frequent symtpoms in the Lt UE. She has poor posture and alignment; limited cervical and shoudler ROM; postural weakness; (+) neural tension test bilat UE's; muscular tightness to palpation through the cervical/thoracic/shoudlers bilaterally. Janace Hoard will benefit form PT to address problems identified.    History and Personal Factors relevant to plan of care: Chronic nature of problems; poor posture and alignment    Clinical Presentation Evolving   Clinical Decision Making Low   Rehab Potential Good   PT Frequency 2x / week   PT Duration 6 weeks   PT Treatment/Interventions Patient/family education;ADLs/Self Care Home Management;Electrical Stimulation;Iontophoresis 25m/ml Dexamethasone;Moist Heat;Traction;Ultrasound;Dry needling;Manual techniques;Therapeutic activities;Therapeutic exercise;Neuromuscular re-education   PT Next Visit Plan review HEP; provide instruction in nerve stretch; manual therapy with joint mobs and deep tissue work cervical and shoudler areas; ROM  bilat shds; postural correction    Consulted and Agree with Plan of Care Patient      Patient will benefit from skilled therapeutic intervention in order to improve the following deficits and impairments:  Postural dysfunction, Improper body mechanics, Pain, Increased fascial restricitons, Increased muscle spasms, Decreased strength, Decreased range of motion, Decreased mobility, Decreased activity tolerance  Visit Diagnosis: Chronic right shoulder pain - Plan: PT plan of care cert/re-cert  Other symptoms and signs involving the musculoskeletal system - Plan: PT plan of care cert/re-cert  Abnormal posture - Plan: PT plan of care cert/re-cert     Problem List Patient Active Problem List   Diagnosis Date Noted  . Rotator cuff strain, right, sequela 03/08/2017  . Carpal tunnel syndrome on right 05/02/2015  . Measles, mumps, rubella (MMR) vaccination status unknown 04/14/2015  . Frequent headaches 04/07/2015  . Numbness and tingling in right hand 04/07/2015  . Mild depression ([place] 04/07/2015  . Acne vulgaris 05/17/2013  . De Quervain's tenosynovitis, right 05/17/2013  . Allergic rhinitis 05/17/2013    Caretha Rumbaugh PNilda SimmerPT, MPH  03/30/2017, 11:45 AM  [facility] [place] [address]Phone: [phone]  Fax:  [phone] Name: BLugene HittMRN: [id number] of Birth: 111/14/1997

## 2017-04-06 ENCOUNTER — Encounter: Payer: Self-pay | Admitting: Rehabilitative and Restorative Service Providers"

## 2017-04-06 ENCOUNTER — Ambulatory Visit (INDEPENDENT_AMBULATORY_CARE_PROVIDER_SITE_OTHER): Payer: BLUE CROSS/BLUE SHIELD | Admitting: Rehabilitative and Restorative Service Providers"

## 2017-04-06 DIAGNOSIS — M25511 Pain in right shoulder: Secondary | ICD-10-CM

## 2017-04-06 DIAGNOSIS — G8929 Other chronic pain: Secondary | ICD-10-CM | POA: Diagnosis not present

## 2017-04-06 DIAGNOSIS — R29898 Other symptoms and signs involving the musculoskeletal system: Secondary | ICD-10-CM

## 2017-04-06 DIAGNOSIS — R293 Abnormal posture: Secondary | ICD-10-CM

## 2017-04-06 NOTE — Therapy (Signed)
[place], [place], [address] Phone: [phone]   Fax:  [phone]  Physical Therapy Treatment  Patient Details  Name: Danielle Porter MRN: [id number] Date of Birth: [birth year] Referring Provider: Dr Dianah Field   Encounter Date: 04/06/2017      PT End of Session - 04/06/17 1117    Visit Number 2   Number of Visits 12   Date for PT Re-Evaluation 05/11/17   PT Start Time 1024   PT Stop Time 1118   PT Time Calculation (min) 54 min   Activity Tolerance Patient tolerated treatment well      History reviewed. No pertinent past medical history.  History reviewed. No pertinent surgical history.  There were no vitals filed for this visit.      Subjective Assessment - 04/06/17 1041    Subjective Danielle Porter reports that her shoulder and neck feel better overall since initial treatment and with her HEP - she irritated the shoulder some yesterday cleaning to get ready for visitors at her house. She is working on her posture and that feels different.    Currently in Pain? No/denies                         [facility] Adult PT Treatment/Exercise - 04/06/17 0001      Neuro Re-ed    Neuro Re-ed Details  working on posture and alignment focus on posterior shoulder girdle with relaxation of upper trap muscles      Shoulder Exercises: Standing   Other Standing Exercises scap squeeze w/ noodle 10 sec x 10    Other Standing Exercises W's x 10      Shoulder Exercises: ROM/Strengthening   UBE (Upper Arm Bike) L2 x 4 min alt fwd/back each minute      Shoulder Exercises: Stretch   Other Shoulder Stretches 3 way doorway stretch 30 sec x 3; snow angel 2-3 min    Other Shoulder Stretches neural mobilization to pt tolerance 20-30 sec x 2 reps supine      Moist Heat Therapy   Number Minutes Moist Heat 20 Minutes   Moist Heat Location Cervical;Shoulder     Electrical Stimulation   Electrical  Stimulation Location bilat cervical/shoulders   Electrical Stimulation Action IFC   Electrical Stimulation Parameters to tolerance   Electrical Stimulation Goals Pain;Tone     Manual Therapy   Manual therapy comments pt supine    Joint Mobilization CPA mobs upper thoracic and cervical spine    Soft tissue mobilization deep tissue work ant/lat/posterior cervical; upper trap; pecs   Myofascial Release anterior chest; bilat shds    Passive ROM PROM cervical spine moving into flexion; flexion with slight rotation                 PT Education - 04/06/17 1041    Education provided Yes   Education Details HEP    Person(s) Educated Patient   Methods Explanation;Demonstration;Tactile cues;Verbal cues;Handout   Comprehension Verbalized understanding;Returned demonstration;Verbal cues required;Tactile cues required             PT Long Term Goals - 04/06/17 1119      PT LONG TERM GOAL #1   Title Improve posture and alignment with patient to demonstrate upright posture with engaged posterior shoudler girdle musculature 05/11/17   Time 6   Period Weeks   Status On-going     PT LONG TERM GOAL #2   Title Decrease radicular symptoms  and pain by 50-75% bilateral UE's 05/11/17   Time 6   Period Weeks   Status On-going     PT LONG TERM GOAL #3   Title Increase AROM Rt shoulder to >/= AROM Lt shoulder 05/11/17   Time 6   Period Weeks   Status On-going     PT LONG TERM GOAL #4   Title Independent in HEP 05/11/17   Time 6   Period Weeks   Status On-going     PT LONG TERM GOAL #5   Title Improve FOTO to </= 25% limitation 05/11/17   Time 6   Period Weeks   Status On-going               Plan - 04/06/17 1118    Clinical Impression Statement Progreeeing well with postural control with decrease in symptoms reported. Not improved tissue extensibility with manual work.    Rehab Potential Good   PT Frequency 2x / week   PT Duration 6 weeks   PT Treatment/Interventions  Patient/family education;ADLs/Self Care Home Management;Electrical Stimulation;Iontophoresis 2m/ml Dexamethasone;Moist Heat;Traction;Ultrasound;Dry needling;Manual techniques;Therapeutic activities;Therapeutic exercise;Neuromuscular re-education   PT Next Visit Plan review HEP; manual therapy with joint mobs and deep tissue work cervical and shoudler areas; ROM bilat shds; posterior shoulder girdle strengthening; postural correction    Consulted and Agree with Plan of Care Patient      Patient will benefit from skilled therapeutic intervention in order to improve the following deficits and impairments:  Postural dysfunction, Improper body mechanics, Pain, Increased fascial restricitons, Increased muscle spasms, Decreased strength, Decreased range of motion, Decreased mobility, Decreased activity tolerance  Visit Diagnosis: Chronic right shoulder pain  Other symptoms and signs involving the musculoskeletal system  Abnormal posture     Problem List Patient Active Problem List   Diagnosis Date Noted  . Rotator cuff strain, right, sequela 03/08/2017  . Carpal tunnel syndrome on right 05/02/2015  . Measles, mumps, rubella (MMR) vaccination status unknown 04/14/2015  . Frequent headaches 04/07/2015  . Numbness and tingling in right hand 04/07/2015  . Mild depression ([place] 04/07/2015  . Acne vulgaris 05/17/2013  . De Quervain's tenosynovitis, right 05/17/2013  . Allergic rhinitis 05/17/2013    Danielle Porter PNilda SimmerPT, MPH  04/06/2017, 11:21 AM  [facility] [address]Phone: [phone]  Fax:  [phone] Name: Danielle MingleMRN: [id number] of Birth: 115-Jun-1997

## 2017-04-06 NOTE — Patient Instructions (Addendum)
Neurovascular: Median Nerve Stretch - Supine    Lie with neck supported, side-bent away from moving arm. Hold right arm out to side, elbow bent, thumb down, fingers and wrist bent back. Slowly straighten elbowas far as possible without pain. Hold for _60__ seconds. Repeat __2__ times per set. Do __2_ per day  Scapular Retraction: Elbow Flexion (Standing)    With elbows bent to 90, pinch shoulder blades together and rotate arms out, keeping elbows bent. Repeat __10__ times per set. Do __1-2__ sets per session. Do __3-4 __ sessions per day.

## 2017-04-08 ENCOUNTER — Encounter: Payer: BLUE CROSS/BLUE SHIELD | Admitting: Physical Therapy

## 2017-04-11 ENCOUNTER — Ambulatory Visit (INDEPENDENT_AMBULATORY_CARE_PROVIDER_SITE_OTHER): Payer: BLUE CROSS/BLUE SHIELD | Admitting: Physical Therapy

## 2017-04-11 DIAGNOSIS — R293 Abnormal posture: Secondary | ICD-10-CM

## 2017-04-11 DIAGNOSIS — R29898 Other symptoms and signs involving the musculoskeletal system: Secondary | ICD-10-CM

## 2017-04-11 DIAGNOSIS — M25511 Pain in right shoulder: Secondary | ICD-10-CM | POA: Diagnosis not present

## 2017-04-11 DIAGNOSIS — G8929 Other chronic pain: Secondary | ICD-10-CM

## 2017-04-11 NOTE — Therapy (Addendum)
[facility] [place]  [place], [place], [address] Phone: [phone]   Fax:  [phone]  Physical Therapy Treatment  Patient Details  Name: Danielle Porter MRN: [id number] Date of Birth: 01/11/1996 Referring Provider: Dr. Dianah Field  Encounter Date: 04/11/2017      PT End of Session - 04/11/17 1149    Visit Number 3   Number of Visits 12   Date for PT Re-Evaluation 05/11/17   PT Start Time 1146   PT Stop Time 1239   PT Time Calculation (min) 53 min   Equipment Utilized During Treatment Gait belt   Activity Tolerance Patient tolerated treatment well;No increased pain   Behavior During Therapy [facility] for tasks assessed/performed      No past medical history on file.  No past surgical history on file.  There were no vitals filed for this visit.      Subjective Assessment - 04/11/17 1149    Subjective Pt reports things are going well, however shoulder is a little sore from lifting heavy items when helping out at family wedding this weekend.    Patient Stated Goals get rid of shoulder symptoms    Currently in Pain? Yes   Pain Score 3    Pain Location Shoulder  upper trap   Pain Orientation Right   Pain Descriptors / Indicators Tightness;Nagging   Aggravating Factors  lifting, sleeping on either side    Pain Relieving Factors heat, time/ waiting for it to pass            [facility] PT Assessment - 04/11/17 0001      Assessment   Medical Diagnosis Rt rotator cuff strain; shoulder dysfunction    Referring Provider Dr. Dianah Field   Onset Date/Surgical Date 05/23/16   Hand Dominance Right   Next MD Visit 04/19/17           [facility] Adult PT Treatment/Exercise - 04/11/17 0001      Self-Care   Self-Care Posture   Posture Pt educated on proper posture in sitting and how to utilize lumbar support.  encouraged to take frequent breaks to avoid increased tension and pain. Pt verbalized understanding.      Shoulder Exercises: Supine   Other Supine Exercises sash with red band x 12 reps each arm;  overhead pull with red band x 10 reps      Shoulder Exercises: Standing   Other Standing Exercises scap squeeze w/ noodle 10 sec x 10    Other Standing Exercises W's x 5 sec hold x 5 reps; repeated with yellow band x 10, repeated with red band.  With red band:  Row BUE x 12; shoulder ext BUE x 12. Multiple tactile cues for form.      Shoulder Exercises: ROM/Strengthening   UBE (Upper Arm Bike) L2 x 4 min alt fwd/back each minute      Shoulder Exercises: Stretch   Other Shoulder Stretches 3 way doorway stretch 30 sec x 3     Moist Heat Therapy   Number Minutes Moist Heat -   Moist Heat Location -     Electrical Stimulation   Electrical Stimulation Location --   Electrical Stimulation Action --   Electrical Stimulation Parameters --   Electrical Stimulation Goals --     Manual Therapy   Soft tissue mobilization STM to Rt upper trap, bilat cervical paraspinals. Pin and stretch to Rt upper trap and scalenes.     Myofascial Release Rt pec release, suboccipital release  Passive ROM PROM Lt lateral flexion and Lt rotation                     PT Long Term Goals - 04/11/17 1245      PT LONG TERM GOAL #1   Title Improve posture and alignment with patient to demonstrate upright posture with engaged posterior shoulder girdle musculature 05/11/17   Time 6   Period Weeks   Status On-going  improving     PT LONG TERM GOAL #2   Title Decrease radicular symptoms and pain by 50-75% bilateral UE's 05/11/17   Time 6   Period Weeks   Status Partially Met     PT LONG TERM GOAL #3   Title Increase AROM Rt shoulder to >/= AROM Lt shoulder 05/11/17   Time 6   Period Weeks   Status On-going     PT LONG TERM GOAL #4   Title Independent in HEP 05/11/17   Time 6   Period Weeks   Status On-going     PT LONG TERM GOAL #5   Title Improve FOTO to </= 25% limitation 05/11/17   Time 6    Period Weeks   Status On-going               Plan - 04/11/17 1312    Clinical Impression Statement Pt was able to tolerate resisted exercises, with minor cues on form, with no increase in symptoms.  Pt was pain free after exercise and manual work; declined modalities.  Progressing towards goals.    Rehab Potential Good   PT Frequency 2x / week   PT Duration 6 weeks   PT Treatment/Interventions Patient/family education;ADLs/Self Care Home Management;Electrical Stimulation;Iontophoresis 3m/ml Dexamethasone;Moist Heat;Traction;Ultrasound;Dry needling;Manual techniques;Therapeutic activities;Therapeutic exercise;Neuromuscular re-education   PT Next Visit Plan Review HEP and progress as tolerated . Manual therapy as indicated.    Consulted and Agree with Plan of Care Patient      Patient will benefit from skilled therapeutic intervention in order to improve the following deficits and impairments:  Postural dysfunction, Improper body mechanics, Pain, Increased fascial restricitons, Increased muscle spasms, Decreased strength, Decreased range of motion, Decreased mobility, Decreased activity tolerance  Visit Diagnosis: Chronic right shoulder pain  Other symptoms and signs involving the musculoskeletal system  Abnormal posture     Problem List Patient Active Problem List   Diagnosis Date Noted  . Rotator cuff strain, right, sequela 03/08/2017  . Carpal tunnel syndrome on right 05/02/2015  . Measles, mumps, rubella (MMR) vaccination status unknown 04/14/2015  . Frequent headaches 04/07/2015  . Numbness and tingling in right hand 04/07/2015  . Mild depression ([place] 04/07/2015  . Acne vulgaris 05/17/2013  . De Quervain's tenosynovitis, right 05/17/2013  . Allergic rhinitis 05/17/2013   JKerin Perna PTA 04/11/17 1:18 PM  [place] [address]Phone: [phone]  Fax:   [phone] Name: Danielle EmmerichMRN: [id number] of Birth: [birth year]

## 2017-04-11 NOTE — Patient Instructions (Signed)
Over Head Pull: Narrow Grip     [place] [phone] - every other day, 2 sets of 10   On back, knees bent, feet flat, band across thighs, elbows straight but relaxed. Pull hands apart (start). Keeping elbows straight, bring arms up and over head, hands toward floor. Keep pull steady on band. Hold momentarily. Return slowly, keeping pull steady, back to start. Repeat __10_ times. Band color __red____   Elisabeth Cara   On back, knees bent, feet flat, left hand on left hip, right hand above left. Pull right arm DIAGONALLY (hip to shoulder) across chest. Bring right arm along head toward floor. Hold momentarily. Slowly return to starting position. Repeat __10_ times. Do with left arm. Band color __red____   Shoulder Rotation: Double Arm   On back, knees bent, feet flat, elbows tucked at sides, bent 90, hands palms up. Pull hands apart and down toward floor, keeping elbows near sides. Hold momentarily. Slowly return to starting position.  Resistive Band Rowing   With resistive band anchored in door, grasp both ends. Keeping elbows bent, pull back, squeezing shoulder blades together. Hold _3-5___ seconds. Repeat _10-20___ times. Do __1__ sessions per day.   Strengthening: Resisted Extension   Hold tubing with both hands, arms forward. Pull arms back, elbow straight. Repeat _10-20___ times per set. Do ____ sets per session. Do _1___ sessions per day.   [facility] at [facility] [address] [place], [place] [address]  [phone] (office) [phone] (fax)

## 2017-04-14 ENCOUNTER — Ambulatory Visit (INDEPENDENT_AMBULATORY_CARE_PROVIDER_SITE_OTHER): Payer: BLUE CROSS/BLUE SHIELD | Admitting: Physical Therapy

## 2017-04-14 DIAGNOSIS — M25511 Pain in right shoulder: Secondary | ICD-10-CM | POA: Diagnosis not present

## 2017-04-14 DIAGNOSIS — R293 Abnormal posture: Secondary | ICD-10-CM | POA: Diagnosis not present

## 2017-04-14 DIAGNOSIS — R29898 Other symptoms and signs involving the musculoskeletal system: Secondary | ICD-10-CM | POA: Diagnosis not present

## 2017-04-14 DIAGNOSIS — G8929 Other chronic pain: Secondary | ICD-10-CM

## 2017-04-14 NOTE — Therapy (Signed)
[place], [place], [address] Phone: [phone]   Fax:  [phone]  Physical Therapy Treatment  Patient Details  Name: Danielle Porter MRN: [id number] Date of Birth: 1996/04/06 Referring Provider: Dr. Dianah Field  Encounter Date: 04/14/2017      PT End of Session - 04/14/17 1147    Visit Number 4   Number of Visits 12   Date for PT Re-Evaluation 05/11/17   PT Start Time 1147   PT Stop Time 1227   PT Time Calculation (min) 40 min   Activity Tolerance Patient tolerated treatment well;No increased pain   Behavior During Therapy [facility] for tasks assessed/performed      No past medical history on file.  No past surgical history on file.  There were no vitals filed for this visit.      Subjective Assessment - 04/14/17 1153    Subjective Danielle Porter reports she used TENS unit yesterday morning due to a little soreness, but today she feels pretty good.     Patient Stated Goals get rid of shoulder symptoms    Currently in Pain? No/denies   Pain Score 0-No pain            [facility] PT Assessment - 04/14/17 0001      Assessment   Medical Diagnosis Rt rotator cuff strain; shoulder dysfunction    Referring Provider Dr. Dianah Field   Onset Date/Surgical Date 05/23/16   Hand Dominance Right   Next MD Visit 04/19/17     AROM   Right Shoulder Flexion 150 Degrees  supine   Right Shoulder External Rotation 90 Degrees   Left Shoulder Flexion 162 Degrees  supine   Left Shoulder External Rotation 90 Degrees          [facility] Adult PT Treatment/Exercise - 04/14/17 0001      Shoulder Exercises: Supine   Other Supine Exercises sash with red band x 12 reps each arm;  overhead pull with red band x 10 reps      Shoulder Exercises: Prone   Flexion Right;Left;10 reps  arms/head off edge of table, with scap depress/retraction.      Shoulder Exercises: Standing   External Rotation Strengthening;Both;10  reps;Theraband   Theraband Level (Shoulder External Rotation) Level 2 (Red)   Flexion Strengthening;Both;Theraband;5 reps  overhead pull, "touch down"   Theraband Level (Shoulder Flexion) Level 2 (Red)   Flexion Limitations Rt shoulder limited ROM compared to Lt   Extension Strengthening;Both;10 reps;Theraband   Theraband Level (Shoulder Extension) Level 2 (Red)   Row Strengthening;Both;10 reps;Theraband   Theraband Level (Shoulder Row) Level 3 (Green)   Other Standing Exercises Rt sash with yellow x 3 reps- changed to supine position     Shoulder Exercises: ROM/Strengthening   UBE (Upper Arm Bike) L2 x 4 min alt fwd/back each minute, standing      Shoulder Exercises: Stretch   Wall Stretch - Flexion 1 rep;20 seconds   Other Shoulder Stretches 3 way doorway stretch 30 sec x 2 sets   Other Shoulder Stretches Rt tricep stretch x 30 sec.                      PT Long Term Goals - 04/11/17 1245      PT LONG TERM GOAL #1   Title Improve posture and alignment with patient to demonstrate upright posture with engaged posterior shoulder girdle musculature 05/11/17   Time 6   Period Weeks   Status On-going  improving     PT LONG TERM GOAL #2   Title Decrease radicular symptoms and pain by 50-75% bilateral UE's 05/11/17   Time 6   Period Weeks   Status Partially Met     PT LONG TERM GOAL #3   Title Increase AROM Rt shoulder to >/= AROM Lt shoulder 05/11/17   Time 6   Period Weeks   Status On-going     PT LONG TERM GOAL #4   Title Independent in HEP 05/11/17   Time 6   Period Weeks   Status On-going     PT LONG TERM GOAL #5   Title Improve FOTO to </= 25% limitation 05/11/17   Time 6   Period Weeks   Status On-going               Plan - 04/14/17 1231    Clinical Impression Statement Pt continues to have decreased Rt shoulder flexion ROM compared to LUE; ER improved.  She tolerated all exercises well, without increase in symptoms.  She requires freq  tactile/VC for engagement of post shoulder girdle muscles during exercise.  Pt making good gains towards goals.    Rehab Potential Good   PT Frequency 2x / week   PT Duration 6 weeks   PT Treatment/Interventions Patient/family education;ADLs/Self Care Home Management;Electrical Stimulation;Iontophoresis 53m/ml Dexamethasone;Moist Heat;Traction;Ultrasound;Dry needling;Manual techniques;Therapeutic activities;Therapeutic exercise;Neuromuscular re-education   PT Next Visit Plan Review HEP and progress as tolerated . Manual therapy as indicated.    Consulted and Agree with Plan of Care Patient      Patient will benefit from skilled therapeutic intervention in order to improve the following deficits and impairments:  Postural dysfunction, Improper body mechanics, Pain, Increased fascial restricitons, Increased muscle spasms, Decreased strength, Decreased range of motion, Decreased mobility, Decreased activity tolerance  Visit Diagnosis: Chronic right shoulder pain  Other symptoms and signs involving the musculoskeletal system  Abnormal posture     Problem List Patient Active Problem List   Diagnosis Date Noted  . Rotator cuff strain, right, sequela 03/08/2017  . Carpal tunnel syndrome on right 05/02/2015  . Measles, mumps, rubella (MMR) vaccination status unknown 04/14/2015  . Frequent headaches 04/07/2015  . Numbness and tingling in right hand 04/07/2015  . Mild depression ([place] 04/07/2015  . Acne vulgaris 05/17/2013  . De Quervain's tenosynovitis, right 05/17/2013  . Allergic rhinitis 05/17/2013   JKerin Perna PTA 04/14/17 12:45 PM  [place] [place] [address]Phone: [phone]  Fax:  [phone] Name: Danielle WigenMRN: [id number] of Birth: 1February 17, 1997

## 2017-04-18 ENCOUNTER — Ambulatory Visit (INDEPENDENT_AMBULATORY_CARE_PROVIDER_SITE_OTHER): Payer: BLUE CROSS/BLUE SHIELD | Admitting: Physical Therapy

## 2017-04-18 DIAGNOSIS — R29898 Other symptoms and signs involving the musculoskeletal system: Secondary | ICD-10-CM

## 2017-04-18 DIAGNOSIS — M25511 Pain in right shoulder: Secondary | ICD-10-CM | POA: Diagnosis not present

## 2017-04-18 DIAGNOSIS — R293 Abnormal posture: Secondary | ICD-10-CM

## 2017-04-18 DIAGNOSIS — G8929 Other chronic pain: Secondary | ICD-10-CM

## 2017-04-18 NOTE — Therapy (Addendum)
[address] [place], [place], [address] Phone: [phone]   Fax:  [phone]  Physical Therapy Treatment  Patient Details  Name: Tanyia Grabbe MRN: [id number] Date of Birth: [birth year] Referring Provider: Dr. Dianah Field  Encounter Date: 04/18/2017      PT End of Session - 04/18/17 1405    Visit Number 5   Number of Visits 12   Date for PT Re-Evaluation 05/11/17   PT Start Time 1401   PT Stop Time 4081   PT Time Calculation (min) 33 min   Activity Tolerance Patient tolerated treatment well;No increased pain   Behavior During Therapy [facility] for tasks assessed/performed      No past medical history on file.  No past surgical history on file.  There were no vitals filed for this visit.      Subjective Assessment - 04/18/17 1406    Subjective Pt reports she is a little sore from practicing guitar for 6 hours yesterday, however overall she "feels great".  She is no longer awaking with numbness/tingling in fingers.    Patient Stated Goals get rid of shoulder symptoms    Currently in Pain? No/denies   Pain Score 0-No pain   Pain Location Shoulder            [facility] PT Assessment - 04/18/17 0001      Assessment   Medical Diagnosis Rt rotator cuff strain; shoulder dysfunction    Referring Provider Dr. Dianah Field   Onset Date/Surgical Date 05/23/16   Hand Dominance Right   Next MD Visit 04/19/17     AROM   Right Shoulder Extension 60 Degrees   Right Shoulder Flexion 160 Degrees   Right Shoulder ABduction 145 Degrees   Right Shoulder Internal Rotation --  to T7 behind back   Left Shoulder Extension 60 Degrees   Left Shoulder Flexion 162 Degrees   Left Shoulder ABduction 145 Degrees   Left Shoulder Internal Rotation --  to T6 behind back          [facility] Adult PT Treatment/Exercise - 04/18/17 0001      Shoulder Exercises: Prone   Flexion Strengthening;Right;10 reps   Horizontal  ABduction 1 Strengthening;Right;10 reps     Shoulder Exercises: Standing   External Rotation Strengthening;Both;10 reps;Theraband   Theraband Level (Shoulder External Rotation) Level 2 (Red)   Extension Strengthening;Both;10 reps;Theraband  tactile cues for shoulder position   Theraband Level (Shoulder Extension) Level 2 (Red)   Row Strengthening;Both;10 reps;Theraband  cues for posture   Theraband Level (Shoulder Row) Level 3 (Green)   Other Standing Exercises Sash with yellow, mirror for visual feedback x 5 reps each arm, 2 sets, 3rd set with red band   Other Standing Exercises reverse push ups with 3 sec hold x 10 reps      Shoulder Exercises: ROM/Strengthening   UBE (Upper Arm Bike) L2 x 4 min alt fwd/back each minute, standing      Shoulder Exercises: Stretch   Cross Chest Stretch 2 reps;20 seconds   Other Shoulder Stretches 3 way doorway stretch 30 sec x 2 sets   Other Shoulder Stretches Rt tricep stretch x 30 sec.                      PT Long Term Goals - 04/18/17 1411      PT LONG TERM GOAL #1   Title Improve posture and alignment with patient to demonstrate upright posture with engaged posterior shoulder girdle  musculature 05/11/17   Time 6   Period Weeks   Status On-going     PT LONG TERM GOAL #2   Title Decrease radicular symptoms and pain by 50-75% bilateral UE's 05/11/17   Time 6   Period Weeks   Status Achieved  50% improvement.      PT LONG TERM GOAL #3   Title Increase AROM Rt shoulder to >/= AROM Lt shoulder 05/11/17   Time 6   Period Weeks   Status On-going     PT LONG TERM GOAL #4   Title Independent in HEP 05/11/17   Time 6   Period Weeks   Status On-going     PT LONG TERM GOAL #5   Title Improve FOTO to </= 25% limitation 05/11/17   Time 6   Period Weeks   Status On-going               Plan - 04/18/17 1713    Clinical Impression Statement Pt tolerated all exercises well, without increase in symptoms.  She needed some  minor cues for posture and alignment with standing exercises.  Her Rt shoulder ROM has improved significantly.  She is close to meeting established goals.    Rehab Potential Good   PT Frequency 2x / week   PT Duration 6 weeks   PT Treatment/Interventions Patient/family education;ADLs/Self Care Home Management;Electrical Stimulation;Iontophoresis 3m/ml Dexamethasone;Moist Heat;Traction;Ultrasound;Dry needling;Manual techniques;Therapeutic activities;Therapeutic exercise;Neuromuscular re-education   PT Next Visit Plan Hold to await further instruction from MD; will cont to progress postural / shoulder strengthening if pt returns after MD visit.    Consulted and Agree with Plan of Care Patient      Patient will benefit from skilled therapeutic intervention in order to improve the following deficits and impairments:  Postural dysfunction, Improper body mechanics, Pain, Increased fascial restricitons, Increased muscle spasms, Decreased strength, Decreased range of motion, Decreased mobility, Decreased activity tolerance  Visit Diagnosis: Chronic right shoulder pain  Other symptoms and signs involving the musculoskeletal system  Abnormal posture     Problem List Patient Active Problem List   Diagnosis Date Noted  . Rotator cuff strain, right, sequela 03/08/2017  . Carpal tunnel syndrome on right 05/02/2015  . Measles, mumps, rubella (MMR) vaccination status unknown 04/14/2015  . Frequent headaches 04/07/2015  . Numbness and tingling in right hand 04/07/2015  . Mild depression ([place] 04/07/2015  . Acne vulgaris 05/17/2013  . De Quervain's tenosynovitis, right 05/17/2013  . Allergic rhinitis 05/17/2013   JKerin Perna PTA 04/18/17 5:17 PM  [address] [address] [address]Phone: [phone]  Fax:  [phone] Name: BOmer MonterMRN: [id number] of Birth: [birth year] PHYSICAL THERAPY  DISCHARGE SUMMARY  Visits from Start of Care: 5  Current functional level related to goals / functional outcomes: See last progress note for discharge status.   Remaining deficits: Unknown    Education / Equipment: HEP Plan: Patient agrees to discharge.  Patient goals were partially met. Patient is being discharged due to being pleased with the current functional level.  ?????     Celyn P. HHelene KelpPT, MPH 05/12/17 10:36 AM

## 2017-04-19 ENCOUNTER — Ambulatory Visit (INDEPENDENT_AMBULATORY_CARE_PROVIDER_SITE_OTHER): Payer: BLUE CROSS/BLUE SHIELD | Admitting: Sports Medicine

## 2017-04-19 ENCOUNTER — Encounter: Payer: Self-pay | Admitting: Sports Medicine

## 2017-04-19 VITALS — BP 115/78 | HR 80 | Resp 16 | Wt 138.3 lb

## 2017-04-19 DIAGNOSIS — S46011S Strain of muscle(s) and tendon(s) of the rotator cuff of right shoulder, sequela: Secondary | ICD-10-CM

## 2017-04-19 DIAGNOSIS — G5601 Carpal tunnel syndrome, right upper limb: Secondary | ICD-10-CM | POA: Diagnosis not present

## 2017-04-19 DIAGNOSIS — Z0000 Encounter for general adult medical examination without abnormal findings: Secondary | ICD-10-CM | POA: Insufficient documentation

## 2017-04-19 DIAGNOSIS — Z23 Encounter for immunization: Secondary | ICD-10-CM | POA: Diagnosis not present

## 2017-04-19 NOTE — Assessment & Plan Note (Signed)
Improving considerably with physical therapy

## 2017-04-19 NOTE — Assessment & Plan Note (Signed)
Checking routine blood work, due for tetanus and influenza vaccination. Needs cervical cancer screening.

## 2017-04-19 NOTE — Progress Notes (Signed)
  Subjective:    CC: Follow-up  HPI: Right carpal tunnel syndrome: Resolved with nighttime splinting.  Right rotator cuff strain: Resolving with physical therapy.  Due for some preventive measures  Past medical history:  Negative.  See flowsheet/record as well for more information.  Surgical history: Negative.  See flowsheet/record as well for more information.  Family history: Negative.  See flowsheet/record as well for more information.  Social history: Negative.  See flowsheet/record as well for more information.  Allergies, and medications have been entered into the medical record, reviewed, and no changes needed.   Review of Systems: No fevers, chills, night sweats, weight loss, chest pain, or shortness of breath.   Objective:    General: Well Developed, well nourished, and in no acute distress.  Neuro: Alert and oriented x3, extra-ocular muscles intact, sensation grossly intact.  HEENT: Normocephalic, atraumatic, pupils equal round reactive to light, neck supple, no masses, no lymphadenopathy, thyroid nonpalpable.  Skin: Warm and dry, no rashes. Cardiac: Regular rate and rhythm, no murmurs rubs or gallops, no lower extremity edema.  Respiratory: Clear to auscultation bilaterally. Not using accessory muscles, speaking in full sentences.  Impression and Recommendations:    Carpal tunnel syndrome on right Essentially resolved with nighttime splitting. She does get some of the symptoms with extreme abduction of the shoulder raising the suspicion for thoracic outlet syndrome, however as symptoms are improving/resolving we are doing further investigation.  Rotator cuff strain, right, sequela Improving considerably with physical therapy  Annual physical exam Checking routine blood work, due for tetanus and influenza vaccination. Needs cervical cancer screening.

## 2017-04-19 NOTE — Assessment & Plan Note (Signed)
Essentially resolved with nighttime splitting. She does get some of the symptoms with extreme abduction of the shoulder raising the suspicion for thoracic outlet syndrome, however as symptoms are improving/resolving we are doing further investigation.

## 2017-04-20 LAB — HEMOGLOBIN A1C
Hgb A1c MFr Bld: 5.2 % (ref <5.7)
Mean Plasma Glucose: 103 mg/dL

## 2017-04-20 LAB — COMPREHENSIVE METABOLIC PANEL
AST: 17 U/L (ref 10–30)
Albumin: 4.3 g/dL (ref 3.6–5.1)
CO2: 22 mmol/L (ref 20–32)
Calcium: 9.3 mg/dL (ref 8.6–10.2)
Chloride: 104 mmol/L (ref 98–110)
Creat: 0.85 mg/dL (ref 0.50–1.10)
Sodium: 139 mmol/L (ref 135–146)
Total Bilirubin: 0.6 mg/dL (ref 0.2–1.2)
Total Protein: 7.2 g/dL (ref 6.1–8.1)

## 2017-04-20 LAB — TSH: TSH: 1.47 mIU/L

## 2017-04-20 LAB — CBC
HCT: 38.4 % (ref 35.0–45.0)
Hemoglobin: 12.4 g/dL (ref 11.7–15.5)
MCH: 29.5 pg (ref 27.0–33.0)
MCHC: 32.3 g/dL (ref 32.0–36.0)
MCV: 91.2 fL (ref 80.0–100.0)
MPV: 9.4 fL (ref 7.5–12.5)
Platelets: 332 K/uL (ref 140–400)
RBC: 4.21 MIL/uL (ref 3.80–5.10)
RDW: 14.9 % (ref 11.0–15.0)
WBC: 4.9 K/uL (ref 3.8–10.8)

## 2017-04-20 LAB — LIPID PANEL W/REFLEX DIRECT LDL
Cholesterol: 184 mg/dL (ref <200)
HDL: 53 mg/dL (ref >50)
LDL-Cholesterol: 117 mg/dL — ABNORMAL HIGH
Non-HDL Cholesterol (Calc): 131 mg/dL — ABNORMAL HIGH (ref <130)
Total CHOL/HDL Ratio: 3.5 ratio (ref <5.0)
Triglycerides: 53 mg/dL (ref <150)

## 2017-04-20 LAB — HIV ANTIBODY (ROUTINE TESTING W REFLEX): HIV 1&2 Ab, 4th Generation: NONREACTIVE

## 2017-04-20 LAB — VITAMIN D 25 HYDROXY (VIT D DEFICIENCY, FRACTURES): Vit D, 25-Hydroxy: 11 ng/mL — ABNORMAL LOW (ref 30–100)

## 2017-04-20 LAB — COMPREHENSIVE METABOLIC PANEL WITH GFR
ALT: 11 U/L (ref 6–29)
Alkaline Phosphatase: 55 U/L (ref 33–115)
BUN: 11 mg/dL (ref 7–25)
Glucose, Bld: 85 mg/dL (ref 65–99)
Potassium: 4.4 mmol/L (ref 3.5–5.3)

## 2017-04-20 MED ORDER — VITAMIN D (ERGOCALCIFEROL) 1.25 MG (50000 UNIT) PO CAPS: 50000.0000 [IU] | ORAL_CAPSULE | ORAL | 0 refills | Status: DC

## 2017-05-05 ENCOUNTER — Ambulatory Visit (INDEPENDENT_AMBULATORY_CARE_PROVIDER_SITE_OTHER): Payer: BLUE CROSS/BLUE SHIELD | Admitting: Obstetrics & Gynecology

## 2017-05-05 ENCOUNTER — Encounter: Payer: Self-pay | Admitting: Obstetrics & Gynecology

## 2017-05-05 VITALS — BP 119/68 | HR 86 | Ht 64.0 in | Wt 137.0 lb

## 2017-05-05 DIAGNOSIS — Z23 Encounter for immunization: Secondary | ICD-10-CM

## 2017-05-05 DIAGNOSIS — Z708 Other sex counseling: Secondary | ICD-10-CM

## 2017-05-05 DIAGNOSIS — Z01419 Encounter for gynecological examination (general) (routine) without abnormal findings: Secondary | ICD-10-CM | POA: Diagnosis not present

## 2017-05-05 DIAGNOSIS — IMO0001 Reserved for inherently not codable concepts without codable children: Secondary | ICD-10-CM

## 2017-05-05 NOTE — Progress Notes (Signed)
GYNECOLOGY ANNUAL PREVENTATIVE CARE ENCOUNTER NOTE  Subjective:   Danielle Porter is a 21 y.o. G0P0000 female here for a routine annual gynecologic exam.  Current complaints: none.  Never had a pap smear. Has never engaged in any form of sexual activity, identifies as being asexual. Currently a college student in [place], family lives in area.  Has never received HPV vaccine series. Has normal, regular periods, no GYN concerns.   Denies abnormal vaginal bleeding, discharge, pelvic pain or other gynecologic concerns.    Gynecologic History Patient's last menstrual period was 04/20/2017 (approximate). Contraception: abstinence  Obstetric History OB History  Gravida Para Term Preterm AB Living  0 0 0 0 0 0  SAB TAB Ectopic Multiple Live Births  0 0 0 0 0        History reviewed. No pertinent past medical history.  History reviewed. No pertinent surgical history.  Current Outpatient Prescriptions on File Prior to Visit  Medication Sig Dispense Refill  . meloxicam (MOBIC) 15 MG tablet One tab PO qAM with breakfast for 2 weeks, then daily prn pain. (Patient not taking: Reported on 04/18/2017) 30 tablet 3  . Vitamin D, Ergocalciferol, (DRISDOL) 50000 units CAPS capsule Take 1 capsule (50,000 Units total) by mouth every 7 (seven) days. Take for 8 total doses(weeks) (Patient not taking: Reported on 05/05/2017) 8 capsule 0   No current facility-administered medications on file prior to visit.     Allergies  Allergen Reactions  . Kenalog [Triamcinolone]     Topical irritation and rash after tendon sheath injection    Social History   Social History  . Marital status: Single    Spouse name: N/A  . Number of children: N/A  . Years of education: N/A   Occupational History  . Not on file.   Social History Main Topics  . Smoking status: Never Smoker  . Smokeless tobacco: Never Used  . Alcohol use No  . Drug use: No  . Sexual activity: No   Other Topics Concern  . Not on  file   Social History Narrative  . No narrative on file    Family History  Problem Relation Age of Onset  . Diabetes Maternal Grandmother     The following portions of the patient's history were reviewed and updated as appropriate: allergies, current medications, past family history, past medical history, past social history, past surgical history and problem list.  Review of Systems Pertinent items noted in HPI and remainder of comprehensive ROS otherwise negative.   Objective:  BP 119/68   Pulse 86   Ht  (1.626 m)   Wt 137 lb (62.1 kg)   LMP 04/20/2017 (Approximate)   BMI 23.52 kg/m  CONSTITUTIONAL: Well-developed, well-nourished female in no acute distress.  HENT:  Normocephalic, atraumatic, External right and left ear normal. Oropharynx is clear and moist EYES: Conjunctivae and EOM are normal. Pupils are equal, round, and reactive to light. No scleral icterus.  NECK: Normal range of motion, supple, no masses.  Normal thyroid.  SKIN: Skin is warm and dry. No rash noted. Not diaphoretic. No erythema. No pallor. NEUROLOGIC: Alert and oriented to person, place, and time. Normal reflexes, muscle tone coordination. No cranial nerve deficit noted. PSYCHIATRIC: Normal mood and affect. Normal behavior. Normal judgment and thought content. CARDIOVASCULAR: Normal heart rate noted, regular rhythm RESPIRATORY: Clear to auscultation bilaterally. Effort and breath sounds normal, no problems with respiration noted. BREASTS: Symmetric in size. No masses, skin changes, nipple drainage, or lymphadenopathy.  ABDOMEN: Soft, normal bowel sounds, no distention noted.  No tenderness, rebound or guarding.  PELVIC: Normal appearing external genitalia; hymen is intact with very small opening (about 5 mm), unable to accommodate speculum.  Speculum exam and pap smear deferred. MUSCULOSKELETAL: Normal range of motion. No tenderness.  No cyanosis, clubbing, or edema.  2+ distal pulses.   Assessment  and Plan:  1. Well woman exam with routine gynecological exam 2. Human papilloma virus (HPV) counseling 3. Human papilloma virus (HPV) type 9 vaccine administered Very low risk for cervical cancer given no exposure to HPV at this point. Counseled about HPV vaccine, she will get this today. No need for pap smear at this point, will reevaluate once she is sexually active or in 1-2 years.    - HPV 9-valent vaccine,Recombinant. Will likely get rest of series in [place], was told she needs it around 2 months and 6 months from now. Routine preventative health maintenance measures emphasized. Please refer to After Visit Summary for other counseling recommendations.    Jaynie CollinsUGONNA  Kerrin Markman, MD, FACOG Attending Obstetrician & Gynecologist, [facility]

## 2017-05-05 NOTE — Patient Instructions (Addendum)
HPV (Human Papillomavirus) Vaccine: What You Need to Know  1. Why get vaccinated?  HPV vaccine prevents infection with human papillomavirus (HPV) types that are associated with many cancers, including:  · cervical cancer in females,  · vaginal and vulvar cancers in females,  · anal cancer in females and males,  · throat cancer in females and males, and  · penile cancer in males.    In addition, HPV vaccine prevents infection with HPV types that cause genital warts in both females and males.  In the [place] about 12,000 women get cervical cancer every year, and about 4,000 women die from it. HPV vaccine can prevent most of these cases of cervical cancer.  Vaccination is not a substitute for cervical cancer screening. This vaccine does not protect against all HPV types that can cause cervical cancer. Women should still get regular Pap tests.  HPV infection usually comes from sexual contact, and most people will become infected at some point in their life. About 14 million Americans, including teens, get infected every year. Most infections will go away on their own and not cause serious problems. But thousands of women and men get cancer and other diseases from HPV.  2. HPV vaccine  HPV vaccine is approved by FDA and is recommended by CDC for both males and females. It is routinely given at 11 or 23 years of age, but it may be given beginning at age 9 years through age 26 years.  Most adolescents 9 through 23 years of age should get HPV vaccine as a two-dose series with the doses separated by 6-12 months. People who start HPV vaccination at 15 years of age and older should get the vaccine as a three-dose series with the second dose given 1-2 months after the first dose and the third dose given 6 months after the first dose. There are several exceptions to these age recommendations. Your health care provider can give you more information.  3. Some people should not get this vaccine   · Anyone who has had a severe (life-threatening) allergic reaction to a dose of HPV vaccine should not get another dose.  · Anyone who has a severe (life threatening) allergy to any component of HPV vaccine should not get the vaccine.  · Tell your doctor if you have any severe allergies that you know of, including a severe allergy to yeast.  · HPV vaccine is not recommended for pregnant women. If you learn that you were pregnant when you were vaccinated, there is no reason to expect any problems for you or your baby. Any woman who learns she was pregnant when she got HPV vaccine is encouraged to contact the manufacturer's registry for HPV vaccination during pregnancy at 1-800-986-8999. Women who are breastfeeding may be vaccinated.  · If you have a mild illness, such as a cold, you can probably get the vaccine today. If you are moderately or severely ill, you should probably wait until you recover. Your doctor can advise you.  4. Risks of a vaccine reaction  With any medicine, including vaccines, there is a chance of side effects. These are usually mild and go away on their own, but serious reactions are also possible.  Most people who get HPV vaccine do not have any serious problems with it.  Mild or moderate problems following HPV vaccine:  · Reactions in the arm where the shot was given:  ? Soreness (about 9 people in 10)  ? Redness or swelling (about 1 person   in 3)  · Fever:  ? Mild (100°F) (about 1 person in 10)  ? Moderate (102°F) (about 1 person in 65)  · Other problems:  ? Headache (about 1 person in 3)  Problems that could happen after any injected vaccine:  · People sometimes faint after a medical procedure, including vaccination. Sitting or lying down for about 15 minutes can help prevent fainting, and injuries caused by a fall. Tell your doctor if you feel dizzy, or have vision changes or ringing in the ears.  · Some people get severe pain in the shoulder and have difficulty moving  the arm where a shot was given. This happens very rarely.  · Any medication can cause a severe allergic reaction. Such reactions from a vaccine are very rare, estimated at about 1 in a million doses, and would happen within a few minutes to a few hours after the vaccination.  As with any medicine, there is a very remote chance of a vaccine causing a serious injury or death.  The safety of vaccines is always being monitored. For more information, visit: www.cdc.gov/vaccinesafety/.  5. What if there is a serious reaction?  What should I look for?  Look for anything that concerns you, such as signs of a severe allergic reaction, very high fever, or unusual behavior.  Signs of a severe allergic reaction can include hives, swelling of the face and throat, difficulty breathing, a fast heartbeat, dizziness, and weakness. These would usually start a few minutes to a few hours after the vaccination.  What should I do?  If you think it is a severe allergic reaction or other emergency that can't wait, call 9-1-1 or get to the nearest hospital. Otherwise, call your doctor.  Afterward, the reaction should be reported to the Vaccine Adverse Event Reporting System (VAERS). Your doctor should file this report, or you can do it yourself through the VAERS web site at www.vaers.hhs.gov, or by calling 1-800-822-7967.  VAERS does not give medical advice.  6. The National Vaccine Injury Compensation Program  The National Vaccine Injury Compensation Program (VICP) is a federal program that was created to compensate people who may have been injured by certain vaccines.  Persons who believe they may have been injured by a vaccine can learn about the program and about filing a claim by calling 1-800-338-2382 or visiting the VICP website at www.hrsa.gov/vaccinecompensation. There is a time limit to file a claim for compensation.  7. How can I learn more?  · Ask your health care provider. He or she can give you the vaccine  package insert or suggest other sources of information.  · Call your local or state health department.  · Contact the Centers for Disease Control and Prevention (CDC):  ? Call 1-800-232-4636 (1-800-CDC-INFO) or  ? Visit CDC’s website at www.cdc.gov/hpv  Vaccine Information Statement, HPV Vaccine (07/25/2015)  This information is not intended to replace advice given to you by your health care provider. Make sure you discuss any questions you have with your health care provider.  Document Released: 03/06/2014 Document Revised: 04/29/2016 Document Reviewed: 04/29/2016  Elsevier Interactive Patient Education © 2017 Elsevier Inc.

## 2017-08-01 ENCOUNTER — Ambulatory Visit: Payer: BLUE CROSS/BLUE SHIELD

## 2017-08-04 ENCOUNTER — Ambulatory Visit (INDEPENDENT_AMBULATORY_CARE_PROVIDER_SITE_OTHER): Payer: BLUE CROSS/BLUE SHIELD | Admitting: Sports Medicine

## 2017-08-04 DIAGNOSIS — Z23 Encounter for immunization: Secondary | ICD-10-CM

## 2017-08-04 NOTE — Progress Notes (Signed)
   Subjective:    Patient ID: Danielle Porter, female    DOB: 09/18/1995, 21 y.o.   MRN: [id number]  HPI Patient here for 2nd in series of 3 HPV 9-valent. States no problems after receiving the first one.    Review of Systems     Objective:   Physical Exam        Assessment & Plan:  Patient tolerated injection well without complications. Will have 3rd and final one in 12 weeks either here or at [place]. Kept for observation x 20 minutes.

## 2018-02-13 ENCOUNTER — Telehealth: Payer: Self-pay | Admitting: Sports Medicine

## 2018-02-13 NOTE — Telephone Encounter (Signed)
Yes that's fine 

## 2018-02-13 NOTE — Telephone Encounter (Signed)
Received a call for f/u vaccination appointment. Please look when it needs to be scheduled and call [phone] to let them know. Thank you :)

## 2018-02-13 NOTE — Telephone Encounter (Signed)
Approved to schedule for [place]. Routing for scheduling.

## 2018-02-13 NOTE — Telephone Encounter (Signed)
Pt is off schedule. OK to administer 3rd and final vaccine?

## 2018-02-20 ENCOUNTER — Ambulatory Visit (INDEPENDENT_AMBULATORY_CARE_PROVIDER_SITE_OTHER): Payer: BLUE CROSS/BLUE SHIELD | Admitting: Sports Medicine

## 2018-02-20 VITALS — BP 112/70 | HR 96 | Temp 97.6°F | Wt 143.0 lb

## 2018-02-20 DIAGNOSIS — Z23 Encounter for immunization: Secondary | ICD-10-CM

## 2018-02-20 NOTE — Progress Notes (Signed)
   Subjective:    Patient ID: Danielle Porter, female    DOB: 08/12/1996, 22 y.o.   MRN: [id number]  HPI Patient in office today for 3rd HPV. Pt tolerated injection well without complications. KG LPN   Review of Systems     Objective:   Physical Exam        Assessment & Plan:
